# Patient Record
Sex: Female | Born: 1985 | Race: Black or African American | Hispanic: No | Marital: Single | Smoking: Former smoker
Health system: Southern US, Community
[De-identification: ages and names within clinical notes are randomized; demographics above are authoritative.]

## PROBLEM LIST (undated history)

## (undated) ENCOUNTER — Inpatient Hospital Stay (HOSPITAL_COMMUNITY): Payer: Self-pay

## (undated) DIAGNOSIS — T7840XA Allergy, unspecified, initial encounter: Secondary | ICD-10-CM

## (undated) DIAGNOSIS — D649 Anemia, unspecified: Secondary | ICD-10-CM

## (undated) DIAGNOSIS — R569 Unspecified convulsions: Secondary | ICD-10-CM

## (undated) DIAGNOSIS — J069 Acute upper respiratory infection, unspecified: Secondary | ICD-10-CM

## (undated) DIAGNOSIS — I10 Essential (primary) hypertension: Secondary | ICD-10-CM

## (undated) DIAGNOSIS — Z8709 Personal history of other diseases of the respiratory system: Secondary | ICD-10-CM

## (undated) HISTORY — DX: Acute upper respiratory infection, unspecified: J06.9

## (undated) HISTORY — DX: Anemia, unspecified: D64.9

## (undated) HISTORY — PX: HEMORRHOID BANDING: SHX5850

## (undated) HISTORY — DX: Allergy, unspecified, initial encounter: T78.40XA

## (undated) HISTORY — DX: Personal history of other diseases of the respiratory system: Z87.09

---
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
## 2005-01-19 ENCOUNTER — Emergency Department (HOSPITAL_COMMUNITY): Admission: EM | Admit: 2005-01-19 | Discharge: 2005-01-19 | Payer: Self-pay | Admitting: Family Medicine

## 2005-02-27 ENCOUNTER — Inpatient Hospital Stay (HOSPITAL_COMMUNITY): Admission: AD | Admit: 2005-02-27 | Discharge: 2005-02-27 | Payer: Self-pay | Admitting: Obstetrics

## 2005-03-01 ENCOUNTER — Emergency Department (HOSPITAL_COMMUNITY): Admission: EM | Admit: 2005-03-01 | Discharge: 2005-03-01 | Payer: Self-pay | Admitting: Emergency Medicine

## 2006-03-31 ENCOUNTER — Inpatient Hospital Stay (HOSPITAL_COMMUNITY): Admission: AD | Admit: 2006-03-31 | Discharge: 2006-03-31 | Payer: Self-pay | Admitting: Obstetrics

## 2006-04-25 ENCOUNTER — Ambulatory Visit (HOSPITAL_COMMUNITY): Admission: RE | Admit: 2006-04-25 | Discharge: 2006-04-25 | Payer: Self-pay | Admitting: Obstetrics

## 2006-08-28 ENCOUNTER — Inpatient Hospital Stay (HOSPITAL_COMMUNITY): Admission: AD | Admit: 2006-08-28 | Discharge: 2006-08-30 | Payer: Self-pay | Admitting: Obstetrics & Gynecology

## 2006-09-05 ENCOUNTER — Emergency Department (HOSPITAL_COMMUNITY): Admission: EM | Admit: 2006-09-05 | Discharge: 2006-09-05 | Payer: Self-pay | Admitting: Emergency Medicine

## 2006-10-17 ENCOUNTER — Emergency Department (HOSPITAL_COMMUNITY): Admission: EM | Admit: 2006-10-17 | Discharge: 2006-10-17 | Payer: Self-pay | Admitting: Emergency Medicine

## 2006-12-07 ENCOUNTER — Emergency Department (HOSPITAL_COMMUNITY): Admission: EM | Admit: 2006-12-07 | Discharge: 2006-12-07 | Payer: Self-pay | Admitting: Emergency Medicine

## 2006-12-12 ENCOUNTER — Inpatient Hospital Stay (HOSPITAL_COMMUNITY): Admission: AD | Admit: 2006-12-12 | Discharge: 2006-12-12 | Payer: Self-pay | Admitting: Obstetrics & Gynecology

## 2007-02-05 ENCOUNTER — Emergency Department (HOSPITAL_COMMUNITY): Admission: EM | Admit: 2007-02-05 | Discharge: 2007-02-05 | Payer: Self-pay | Admitting: Emergency Medicine

## 2007-02-11 ENCOUNTER — Emergency Department (HOSPITAL_COMMUNITY): Admission: EM | Admit: 2007-02-11 | Discharge: 2007-02-11 | Payer: Self-pay | Admitting: Emergency Medicine

## 2007-03-20 ENCOUNTER — Inpatient Hospital Stay (HOSPITAL_COMMUNITY): Admission: AD | Admit: 2007-03-20 | Discharge: 2007-03-20 | Payer: Self-pay | Admitting: Obstetrics & Gynecology

## 2007-07-28 HISTORY — PX: OTHER SURGICAL HISTORY: SHX169

## 2008-04-29 ENCOUNTER — Emergency Department (HOSPITAL_COMMUNITY): Admission: EM | Admit: 2008-04-29 | Discharge: 2008-04-29 | Payer: Self-pay | Admitting: Family Medicine

## 2009-10-03 ENCOUNTER — Emergency Department (HOSPITAL_COMMUNITY): Admission: EM | Admit: 2009-10-03 | Discharge: 2009-10-03 | Payer: Self-pay | Admitting: Family Medicine

## 2009-11-29 ENCOUNTER — Emergency Department (HOSPITAL_COMMUNITY): Admission: EM | Admit: 2009-11-29 | Discharge: 2009-11-29 | Payer: Self-pay | Admitting: Family Medicine

## 2009-12-02 ENCOUNTER — Emergency Department (HOSPITAL_COMMUNITY): Admission: EM | Admit: 2009-12-02 | Discharge: 2009-12-03 | Payer: Self-pay | Admitting: Emergency Medicine

## 2010-05-12 LAB — WET PREP, GENITAL: WBC, Wet Prep HPF POC: NONE SEEN

## 2010-05-12 LAB — POCT URINALYSIS DIPSTICK
Ketones, ur: NEGATIVE mg/dL
Specific Gravity, Urine: >=1.03 (ref 1.005–1.030)

## 2010-07-14 NOTE — Discharge Summary (Signed)
NAME:  Rhonda Kennedy, Rhonda Kennedy                        ACCOUNT NO.:  [id number]   MEDICAL RECORD NO.:  [id number]                   PATIENT TYPE:  INP   LOCATION:  9110                                 FACILITY:  [facility]   PHYSICIAN:  Charles A. Clearance Coots, M.D.             DATE OF BIRTH:  [birth year]   DATE OF ADMISSION:  05/07/2002  DATE OF DISCHARGE:  05/11/2002                                 DISCHARGE SUMMARY   ADMISSION DIAGNOSES:  1. Post dates pregnancy.  2. Two-stage induction of labor.   DISCHARGE DIAGNOSES:  1. Post dates pregnancy.  2. Two-stage induction of labor.  3. Status post normal spontaneous vaginal delivery of viable female on May 09, 2002; Apgars of 8 at one minute, 9 at five minutes; weight of 3700     grams; length of 52 cm.  Mother and infant discharged home in good     condition.   REASON FOR ADMISSION:  A 25 year old black female G1, estimated date of  confinement of May 05, 2002; admitted on May 07, 2002 for induction of  labor for post dates pregnancy.  Prenatal care at the [facility]  with onset of prenatal care at [redacted] weeks gestation.  Prenatal care was  complicated by second trimester cervicitis with bacterial vaginosis and  preterm contractions, and also labile blood pressures which resolved.  Group  B strep was positive.   PAST MEDICAL HISTORY:  1. Surgery:  None.  2. Illnesses:  None.   MEDICATIONS:  Prenatal vitamins.   ALLERGIES:  No known drug allergies.   SOCIAL HISTORY:  Single, adolescent.  Positive history of alcohol abuse.   REVIEW OF SYSTEMS:  Per history of present illness.   FAMILY HISTORY:  Noncontributory.   PHYSICAL EXAMINATION:  GENERAL:  Well-nourished, well-developed black female  in no acute distress.  HEENT:  Normal.  LUNGS:  Clear to auscultation bilaterally.  HEART:  Regular rate and rhythm.  ABDOMEN:  Soft, gravid, nontender.  PELVIC:  Cervix long, closed, posterior, vertex at a -3 station.   ADMITTING  LABORATORY VALUES:  Hemoglobin 11.2; hematocrit 33.1; white blood  cell count 9500; platelets 277,000.  Comprehensive metabolic panel was  within normal limits.  Urinalysis was within normal limits.  RPR was  nonreactive.   HOSPITAL COURSE:  The patient was admitted and initially received cervical  ripening with Cervidil.  Cervidil was unsuccessful in achieving the cervical  ripening that was desired and the patient received a second day of cervical  ripening with Cytotec per protocol.  She responded well to Cytotec cervical  ripening and on the following morning the patient was having contractions  every two minutes and progressed to active labor, and delivered a viable  female infant on May 09, 2002.  There were no intrapartum complications.  Postpartum course was uncomplicated except for some mild discomfort from  hemorrhoids and supportive measures were given for  that.  She was discharged  home on postpartum day #2 in good condition except for some anemia, for  which she was not symptomatic.   DISCHARGE LABORATORY VALUES:  Hemoglobin 8.7; hematocrit 26.4; white blood  cell count 15,200; platelets 237,000.   DISCHARGE DISPOSITION:  1. Medications:     a. Continue prenatal vitamins.     b. Hemocyte Plus one tablet daily.     c. Ibuprofen was prescribed for pain.  2. Routine written obstetrical instructions were given per booklet for     discharge.  3. The patient is to follow up at the [facility] in six weeks for     a postpartum check.                                               Charles A. Clearance Coots, M.D.    CAH/MEDQ  D:  05/11/2002  T:  05/11/2002  Job:  [id number]   cc:   Attn:  Dr. Coral Ceo [facility]

## 2010-08-23 ENCOUNTER — Ambulatory Visit (INDEPENDENT_AMBULATORY_CARE_PROVIDER_SITE_OTHER): Payer: Medicaid Other | Admitting: General Surgery

## 2010-08-23 ENCOUNTER — Encounter (INDEPENDENT_AMBULATORY_CARE_PROVIDER_SITE_OTHER): Payer: Self-pay | Admitting: General Surgery

## 2010-08-23 DIAGNOSIS — K649 Unspecified hemorrhoids: Secondary | ICD-10-CM | POA: Insufficient documentation

## 2010-08-23 DIAGNOSIS — Z803 Family history of malignant neoplasm of breast: Secondary | ICD-10-CM

## 2010-08-23 DIAGNOSIS — K648 Other hemorrhoids: Secondary | ICD-10-CM

## 2010-08-23 DIAGNOSIS — I10 Essential (primary) hypertension: Secondary | ICD-10-CM | POA: Insufficient documentation

## 2010-08-23 DIAGNOSIS — J40 Bronchitis, not specified as acute or chronic: Secondary | ICD-10-CM

## 2010-08-23 DIAGNOSIS — D649 Anemia, unspecified: Secondary | ICD-10-CM

## 2010-08-23 NOTE — Progress Notes (Signed)
Subjective:     Patient ID: Rhonda Kennedy, female   DOB: [birth year], 25 y.o.   MRN: [id number]    BP 132/82  Pulse 60  Temp(Src) 96.6 F (35.9 C) (Temporal)  Ht 5\' 4"  (1.626 m)  Wt 117 lb 12.8 oz (53.434 kg)  BMI 20.22 kg/m2    HPI The patient presents complaining of hemorrhoids. She's had for several years. We are asked to see her in consultation by her primary care provider. She has noted intermittent bleeding. There is also itching occasionally. When she has a bowel movement the hemorrhoid prolapses out. She has occasional constipation but this has not seemed to make things worse. She has no other current complaints.  Review of Systems  Constitutional: Negative.   HENT: Negative.   Eyes: Negative.   Respiratory: Negative.   Cardiovascular: Negative.   Gastrointestinal: Positive for constipation, blood in stool and anal bleeding.  Genitourinary: Negative.   Musculoskeletal: Negative.   Neurological: Negative.   Hematological: Negative.   Psychiatric/Behavioral: Negative.        Objective:   Physical Exam  Constitutional: She is oriented to person, place, and time. She appears well-developed.  HENT:  Head: Normocephalic and atraumatic.  Eyes: Pupils are equal, round, and reactive to light.  Neck: Normal range of motion. Neck supple.  Cardiovascular: Normal rate and normal heart sounds.   No murmur heard. Pulmonary/Chest: Effort normal and breath sounds normal. No respiratory distress. She has no wheezes. She has no rales.  Abdominal: Soft. Bowel sounds are normal. She exhibits no distension and no mass. There is no tenderness.  Lymphadenopathy:    She has no cervical adenopathy.  Neurological: She is alert and oriented to person, place, and time.  Skin: Skin is warm and dry.       Externally anal exam reveals several skin tags but no active external hemorrhoids. There was no perianal tenderness. Digital rectal exam revealed posterior midline internal hemorrhoids.  Anoscopy was then done demonstrating a large posterior inflamed internal hemorrhoid. This was grasped with banding forceps and was insensate. Her pain was applied and the patient tolerated this well with no significant bleeding. There were some smaller more distal internal hemorrhoids without acute inflammation. These were not amenable to banding. The patient tolerated this well. Assessment:     internal hemorrhoids    Plan:     Banding was accomplished as described above. I advised her on the usual course after banding.She will drink plenty of water to avoid constipation. I will see her back in 6 weeks.

## 2010-10-13 ENCOUNTER — Ambulatory Visit (INDEPENDENT_AMBULATORY_CARE_PROVIDER_SITE_OTHER): Payer: Medicaid Other | Admitting: General Surgery

## 2010-10-16 ENCOUNTER — Inpatient Hospital Stay (INDEPENDENT_AMBULATORY_CARE_PROVIDER_SITE_OTHER)
Admission: RE | Admit: 2010-10-16 | Discharge: 2010-10-16 | Disposition: A | Discharge status: Home / Self Care | Payer: Medicaid Other | Source: Ambulatory Visit | Attending: Emergency Medicine | Admitting: Emergency Medicine

## 2010-10-16 DIAGNOSIS — J069 Acute upper respiratory infection, unspecified: Secondary | ICD-10-CM

## 2010-10-16 LAB — POCT RAPID STREP A: Streptococcus, Group A Screen (Direct): NEGATIVE

## 2010-11-06 ENCOUNTER — Encounter (INDEPENDENT_AMBULATORY_CARE_PROVIDER_SITE_OTHER): Payer: Self-pay | Admitting: Surgery

## 2010-11-06 ENCOUNTER — Ambulatory Visit (INDEPENDENT_AMBULATORY_CARE_PROVIDER_SITE_OTHER): Payer: Medicaid Other | Admitting: Surgery

## 2010-11-06 DIAGNOSIS — K649 Unspecified hemorrhoids: Secondary | ICD-10-CM

## 2010-11-06 NOTE — Patient Instructions (Signed)

## 2010-11-06 NOTE — Progress Notes (Signed)
Subjective:     Patient ID: Rhonda Kennedy, female   DOB: January 19, 1986, 25 y.o.   MRN: [id number]  HPI  Reason for visit: Followup after hemorrhoidal banding by Dr. Janee Morn June 2012.  Patient comes today noting she still having prolapse. Having 1-2 soft/loose bowel movements a day. No severe bleeding. Some discomfort with bowel movements. She never noticed if the band passed or not.  Review of Systems  Constitutional: Negative for fever, chills, diaphoresis, appetite change and fatigue.  HENT: Negative for ear pain, sore throat, trouble swallowing, neck pain and ear discharge.   Eyes: Negative for photophobia, discharge and visual disturbance.  Respiratory: Negative for cough, choking, chest tightness and shortness of breath.   Cardiovascular: Negative for chest pain and palpitations.  Gastrointestinal: Positive for anal bleeding and rectal pain. Negative for nausea, vomiting, abdominal pain, diarrhea and constipation.  Genitourinary: Negative for dysuria, frequency and difficulty urinating.  Musculoskeletal: Negative for myalgias and gait problem.  Skin: Negative for color change, pallor and rash.  Neurological: Negative for dizziness, speech difficulty, weakness and numbness.  Hematological: Negative for adenopathy.  Psychiatric/Behavioral: Negative for confusion and agitation. The patient is not nervous/anxious.        Objective:   Physical Exam  Constitutional: She is oriented to person, place, and time. She appears well-developed and well-nourished. No distress.  HENT:  Head: Normocephalic.  Mouth/Throat: Oropharynx is clear and moist. No oropharyngeal exudate.  Eyes: Conjunctivae and EOM are normal. Pupils are equal, round, and reactive to light. No scleral icterus.  Neck: Normal range of motion. Neck supple. No tracheal deviation present.  Cardiovascular: Normal rate, regular rhythm and intact distal pulses.   Pulmonary/Chest: Effort normal and breath sounds normal. No  respiratory distress. She exhibits no tenderness.  Abdominal: Soft. She exhibits no distension and no mass. There is no tenderness. Hernia confirmed negative in the right inguinal area and confirmed negative in the left inguinal area.  Genitourinary: Vagina normal. No vaginal discharge found.       The patient's symptoms are not adequately controlled.  Therefore, I recommended banding & sclerotherapy injection to treat the hemorrhoids.  I went over the technique, risks, benefits, and alternatives.   Goals of post-operative recovery were discussed as well.  Questions were answered.  The patient expressed understanding & wished to proceed.  The patient was positioned in the lateral decubitus position.  Perianal & rectal examination was done.  Using anoscopy,  I noted R Ant>R post> L lat hemorrhoids with some spontaneous prolapse.  Ligated R ant hem, other 2 areas too sensitive to band.  Therefore injected w sclerosing agent.  She tolerated  Musculoskeletal: Normal range of motion. She exhibits no tenderness.  Lymphadenopathy:    She has no cervical adenopathy.       Right: No inguinal adenopathy present.       Left: No inguinal adenopathy present.  Neurological: She is alert and oriented to person, place, and time. No cranial nerve deficit. She exhibits normal muscle tone. Coordination normal.  Skin: Skin is warm and dry. No rash noted. She is not diaphoretic. No erythema.  Psychiatric: She has a normal mood and affect. Her behavior is normal. Judgment and thought content normal.       Assessment:     Hemorrhoids with prolapse and occasional bleeding and discomfort. Second episode of office intervention (banding and injection).     Plan:     At this point, I am hopeful that these 2 treatments will be enough  to calm things down. However, I noted that after about 2 would certainly 3 interventions in the office, I think she should more seriously consider surgery. If she still has multiple  locations, she'd be a good candidate for [facility] ligation and hemorrhoidopexy. She can consider PPH if Dr Janee Morn prefers vs standard hemorrhoidectomy.  She can followup with Dr. Janee Morn or myself as needed. The anatomy & physiology of the anorectal region was discussed.  The pathophysiology of hemorrhoids and differential diagnosis was discussed.  Natural history progression  was discussed.   I stressed the importance of a bowel regimen to have daily soft bowel movements to minimize progression of disease.     Educational handouts further explaining the pathology, treatment options, and bowel regimen were given as well.

## 2010-11-16 LAB — URINALYSIS, ROUTINE W REFLEX MICROSCOPIC
Glucose, UA: NEGATIVE
Nitrite: NEGATIVE
Protein, ur: NEGATIVE
Specific Gravity, Urine: 1.02
pH: 7.5

## 2010-11-16 LAB — POCT PREGNANCY, URINE
Operator id: 120561
Preg Test, Ur: NEGATIVE

## 2010-11-16 LAB — GC/CHLAMYDIA PROBE AMP, GENITAL
Chlamydia, DNA Probe: NEGATIVE
GC Probe Amp, Genital: NEGATIVE

## 2010-11-16 LAB — WET PREP, GENITAL: Yeast Wet Prep HPF POC: NONE SEEN

## 2010-11-16 LAB — URINE MICROSCOPIC-ADD ON

## 2010-12-06 LAB — URINALYSIS, ROUTINE W REFLEX MICROSCOPIC
Bilirubin Urine: NEGATIVE
Ketones, ur: NEGATIVE

## 2010-12-06 LAB — URINE MICROSCOPIC-ADD ON

## 2010-12-08 LAB — POCT URINALYSIS DIP (DEVICE)
Glucose, UA: NEGATIVE
Ketones, ur: NEGATIVE
Nitrite: NEGATIVE
Operator id: 247071
Specific Gravity, Urine: 1.015

## 2010-12-08 LAB — POCT PREGNANCY, URINE: Operator id: 239701

## 2010-12-08 LAB — URINE CULTURE

## 2010-12-12 LAB — CBC
HCT: 38.1
Hemoglobin: 12.9
MCHC: 33.7
MCHC: 34.9
MCV: 91
Platelets: 219
Platelets: 262
RDW: 13.4
RDW: 13.5

## 2010-12-12 LAB — RPR: RPR Ser Ql: NONREACTIVE

## 2010-12-14 ENCOUNTER — Emergency Department (HOSPITAL_COMMUNITY)
Admission: EM | Admit: 2010-12-14 | Discharge: 2010-12-14 | Disposition: A | Discharge status: Home / Self Care | Payer: Medicaid Other | Attending: Emergency Medicine | Admitting: Emergency Medicine

## 2010-12-14 ENCOUNTER — Emergency Department (HOSPITAL_COMMUNITY): Payer: Medicaid Other

## 2010-12-14 ENCOUNTER — Inpatient Hospital Stay (INDEPENDENT_AMBULATORY_CARE_PROVIDER_SITE_OTHER)
Admission: RE | Admit: 2010-12-14 | Discharge: 2010-12-14 | Disposition: A | Discharge status: Home / Self Care | Payer: Medicaid Other | Source: Ambulatory Visit | Attending: Family Medicine | Admitting: Family Medicine

## 2010-12-14 DIAGNOSIS — R569 Unspecified convulsions: Secondary | ICD-10-CM | POA: Insufficient documentation

## 2010-12-14 DIAGNOSIS — R51 Headache: Secondary | ICD-10-CM | POA: Insufficient documentation

## 2010-12-14 DIAGNOSIS — F29 Unspecified psychosis not due to a substance or known physiological condition: Secondary | ICD-10-CM | POA: Insufficient documentation

## 2010-12-14 DIAGNOSIS — X58XXXA Exposure to other specified factors, initial encounter: Secondary | ICD-10-CM | POA: Insufficient documentation

## 2010-12-14 DIAGNOSIS — S0990XA Unspecified injury of head, initial encounter: Secondary | ICD-10-CM | POA: Insufficient documentation

## 2010-12-14 DIAGNOSIS — R4182 Altered mental status, unspecified: Secondary | ICD-10-CM | POA: Insufficient documentation

## 2010-12-14 DIAGNOSIS — S01502A Unspecified open wound of oral cavity, initial encounter: Secondary | ICD-10-CM | POA: Insufficient documentation

## 2010-12-14 DIAGNOSIS — R404 Transient alteration of awareness: Secondary | ICD-10-CM | POA: Insufficient documentation

## 2010-12-14 DIAGNOSIS — I10 Essential (primary) hypertension: Secondary | ICD-10-CM | POA: Insufficient documentation

## 2010-12-14 LAB — URINALYSIS, ROUTINE W REFLEX MICROSCOPIC
Glucose, UA: >1000 mg/dL — AB
Specific Gravity, Urine: 1.024 (ref 1.005–1.030)
Urobilinogen, UA: 0.2 mg/dL (ref 0.0–1.0)

## 2010-12-14 LAB — COMPREHENSIVE METABOLIC PANEL
BUN: 8 mg/dL (ref 6–23)
CO2: 22 mEq/L (ref 19–32)
Chloride: 101 mEq/L (ref 96–112)
Creatinine, Ser: 0.72 mg/dL (ref 0.50–1.10)
GFR calc non Af Amer: >90 mL/min (ref >90)
Total Bilirubin: 0.4 mg/dL (ref 0.3–1.2)

## 2010-12-14 LAB — CBC
Hemoglobin: 14.9 g/dL (ref 12.0–15.0)
MCH: 30.2 pg (ref 26.0–34.0)
MCV: 88.2 fL (ref 78.0–100.0)
Platelets: 240 10*3/uL (ref 150–400)
RBC: 4.93 MIL/uL (ref 3.87–5.11)

## 2010-12-14 LAB — ETHANOL: Alcohol, Ethyl (B): <11 mg/dL (ref 0–11)

## 2010-12-14 LAB — RAPID URINE DRUG SCREEN, HOSP PERFORMED: Opiates: NOT DETECTED

## 2010-12-14 LAB — DIFFERENTIAL
Eosinophils Absolute: 0 10*3/uL (ref 0.0–0.7)
Eosinophils Relative: 0 % (ref 0–5)
Lymphocytes Relative: 5 % — ABNORMAL LOW (ref 12–46)
Monocytes Absolute: 1.1 10*3/uL — ABNORMAL HIGH (ref 0.1–1.0)
Monocytes Relative: 7 % (ref 3–12)

## 2010-12-14 LAB — URINE MICROSCOPIC-ADD ON

## 2010-12-28 NOTE — Consult Note (Signed)
NAMEYOLAND, SCHERR              ACCOUNT NO.:  [id number]  MEDICAL RECORD NO.:  [id number]  LOCATION:  URG                          FACILITY:  [facility]  PHYSICIAN:  Marlan Palau, M.D.  DATE OF BIRTH:  Apr 05, 1985  DATE OF CONSULTATION: DATE OF DISCHARGE:  12/14/2010                                CONSULTATION   HISTORY OF PRESENT ILLNESS:  Rhonda Kennedy is a 25 year old right- handed black female, born 17-Jun-1985 with a history of hypertension.  The patient is not on medications at this time.  The patient comes to the [facility] Room for evaluation of new onset seizures.  The patient was found by her boyfriend around 2 p.m. somewhat lethargic.  The patient had bitten her tongue.  For this reason, the patient was taken to urgent medical care.  The patient had a witnessed seizure event in the waiting room there.  The patient indicates she had a stomach ache and then this progressed onto a generalized seizure.  The patient stiffened on all portions and jerked biting her tongue.  The patient did fall at the Urgent Care and bumped her right brow.  The patient was brought to the emergency room at [facility] after receiving IV Ativan.  The patient has undergone a CT scan of the brain that reveals the scalp hematoma but otherwise the scan of the head was normal.  Cervical spine CAT scan was done that was unremarkable.  The patient was observed in the emergency room and eventually Neurology was called for an evaluation.  Urine drug screen was positive for THC but otherwise unremarkable.  Blood work showed elevation in white count of 16.  The patient denies any prior blackout events or seizure-type events.  No family history of seizures are noted.  PAST MEDICAL HISTORY:  Significant for: 1. History of new onset seizure. 2. History of hypertension. 3. History of dental extractions in the past. 4. History of asthma.  MEDICATIONS:  Albuterol inhaler 2 puffs  every 6 hours if needed.  The patient is on no other medications.  ALLERGIES:  The patient states no known allergies.  Smokes 3-4 cigarettes daily.  Drinks alcohol on occasion.  Smokes marijuana.  SOCIAL HISTORY:  The patient is single, lives with a boyfriend, has 2 children who are alive and well.  The patient works as an Environmental health practitioner.  FAMILY MEDICAL HISTORY:  Both parents are still living.  Father is in good health.  Mother has had a gunshot wound and has some dyslipidemia. The patient has 3 brothers and 3 sisters who are alive and well.  Once again, no family history of seizures is noted.  REVIEW OF SYSTEMS:  Notable for no recent fevers, chills.  The patient denies any vision changes.  Did have a headache today.  The patient usually does not get headaches.  The patient denies any neck stiffness. Denies chest pain, shortness of breath.  Pain was noted prior to the onset of the seizure.  The patient denies problems controlling the bowels or bladder.  Has not had any focal numbness or weakness on the face, arms, or legs.  Denies dizziness.  PHYSICAL EXAMINATION:  VITALS:  Blood pressure is 118/80, heart rate 78, respiratory rate 18, temperature afebrile.  GENERAL:  This patient is a fairly well-developed black female who is alert and cooperative at time of examination. HEENT:  Head is atraumatic.  Eyes:  Pupils equal, round, react to light. Disks were not visualized. NECK:  Supple.  No carotid bruits noted. RESPIRATORY:  Clear. CARDIOVASCULAR:  Reveals a regular rate and rhythm.  No obvious murmurs or rubs are noted. EXTREMITIES:  Without significant edema. NEUROLOGIC:  Cranial nerves as above.  Facial symmetry is present.  The patient has hematoma around the right eye and right eye brow.  The patient has good pinprick sensation on the face.  Speech is normal.  No aphasia or dysarthria is noted.  Significant lacerations noted at the lateral aspect of the tongue on  the right.  The patient has normal strength in all 4s.  Good symmetric motor tone is noted throughout. Sensory testing is intact to pinprick, soft touch, vibratory sensation throughout.  The patient has good finger-nose-finger and toe-to-finger bilaterally.  Gait was not tested.  Deep tendon reflexes are symmetric and normal.  No drift is seen in the upper extremities.  The patient is sleepy but is easily aroused at the time of examination.  Laboratory values are notable for white count of 16.3, hemoglobin of 14.9, hematocrit of 43.5, MCV of 88.2.  Sodium 134, potassium 3.8, chloride 101, CO2 22, glucose 112, BUN of 8, creatinine 0.72, alk phosphatase of 42, SGOT of 24, SGPT of 12, total protein 7.5 albumin 4.2, calcium 9.3.  Urinalysis reveals specific gravity of 1.024, pH of 5.5, greater than 1000 mg/dL of glucose, 0-2 red cells.  CT scan of the head is as above.  IMPRESSION: 1. New onset seizure x2 today. 2. History of hypertension.  This patient has had 2 primary generalized seizures today without aura. No family history of seizures is noted.  The patient will be placed on Keppra.  At this point, 1000 mg IV loading will go to 500 mg twice daily.  The patient will follow up with [facility] for management of the seizures and will have an outpatient MRI and EEG study.  The patient may return home at this point with observation.  The patient is not to operate a motor vehicle until further notice.     Marlan Palau, M.D.     CKW/MEDQ  D:  12/14/2010  T:  12/15/2010  Job:  [id number]  cc:   [facility]  Electronically Signed by Thana Farr M.D. on 12/28/2010 09:29:55 AM

## 2011-01-01 ENCOUNTER — Other Ambulatory Visit: Payer: Self-pay | Admitting: Neurology

## 2011-01-01 DIAGNOSIS — G40309 Generalized idiopathic epilepsy and epileptic syndromes, not intractable, without status epilepticus: Secondary | ICD-10-CM

## 2011-01-04 ENCOUNTER — Ambulatory Visit
Admission: RE | Admit: 2011-01-04 | Discharge: 2011-01-04 | Disposition: A | Discharge status: Home / Self Care | Payer: Medicaid Other | Source: Ambulatory Visit | Attending: Neurology | Admitting: Neurology

## 2011-01-04 DIAGNOSIS — G40309 Generalized idiopathic epilepsy and epileptic syndromes, not intractable, without status epilepticus: Secondary | ICD-10-CM

## 2011-01-04 MED ORDER — GADOBENATE DIMEGLUMINE 529 MG/ML IV SOLN
10.0000 mL | Freq: Once | INTRAVENOUS | Status: AC | PRN
  Administered 2011-01-04: 10 mL via INTRAVENOUS

## 2011-03-08 ENCOUNTER — Emergency Department (HOSPITAL_COMMUNITY)
Admission: EM | Admit: 2011-03-08 | Discharge: 2011-03-08 | Disposition: A | Discharge status: Home / Self Care | Payer: Medicaid Other | Source: Home / Self Care | Attending: Emergency Medicine | Admitting: Emergency Medicine

## 2011-03-08 ENCOUNTER — Encounter (HOSPITAL_COMMUNITY): Payer: Self-pay

## 2011-03-08 DIAGNOSIS — L089 Local infection of the skin and subcutaneous tissue, unspecified: Secondary | ICD-10-CM

## 2011-03-08 DIAGNOSIS — A499 Bacterial infection, unspecified: Secondary | ICD-10-CM

## 2011-03-08 DIAGNOSIS — B9689 Other specified bacterial agents as the cause of diseases classified elsewhere: Secondary | ICD-10-CM

## 2011-03-08 DIAGNOSIS — R030 Elevated blood-pressure reading, without diagnosis of hypertension: Secondary | ICD-10-CM

## 2011-03-08 HISTORY — DX: Essential (primary) hypertension: I10

## 2011-03-08 HISTORY — DX: Unspecified convulsions: R56.9

## 2011-03-08 MED ORDER — SULFAMETHOXAZOLE-TRIMETHOPRIM 800-160 MG PO TABS: 1.0000 | ORAL_TABLET | Freq: Two times a day (BID) | ORAL | Status: AC

## 2011-03-08 NOTE — ED Notes (Signed)
Pt states she noticed 2 small bumps to post. Aspect of lt upper arm on Sunday and thought something bit her.  States 2-3 days ago it began draining and is now an open area on her skin that continues to drain.

## 2011-03-08 NOTE — ED Provider Notes (Signed)
History     CSN: [id number]  Arrival date & time 03/08/11  4098   First MD Initiated Contact with Patient 03/08/11 [phone]      Chief Complaint  Patient presents with  . Insect Bite    (Consider location/radiation/quality/duration/timing/severity/associated sxs/prior treatment) HPI Comments: Slept on the floor, Saturday, woke up with two bumps on my upper L arm, they became like two little bumps and started draining pus- i use peroxide squeezed them, they drained, now have this "nasty" scar, still hurts and drains stuff", its itchy and tender  Patient is a 26 y.o. female presenting with rash. The history is provided by the patient.  Rash  This is a new problem. The current episode started more than 2 days ago. The problem has been gradually improving. There has been no fever. The rash is present on the left arm. The pain is at a severity of 5/10. The pain is mild. The pain has been constant since onset. Associated symptoms include itching and pain. Pertinent negatives include no weeping. Treatments tried: peroxide- and alcohol- The treatment provided no relief.    Past Medical History  Diagnosis Date  . Hemorrhoids   . Asthma   . History of bronchitis   . Seizures   . Hypertension     Past Surgical History  Procedure Date  . Birth control 07/2007    Mirena 100    Family History  Problem Relation Age of Onset  . Hypertension Mother   . Hyperlipidemia Mother   . Hypertension Sister   . Other Sister     anxiety    History  Substance Use Topics  . Smoking status: Current Everyday Smoker -- 0.5 packs/day    Types: Cigarettes  . Smokeless tobacco: Not on file  . Alcohol Use: 1.5 oz/week    3 drink(s) per week     per week    OB History    Grav Para Term Preterm Abortions TAB SAB Ect Mult Living                  Review of Systems  Constitutional: Negative for fever and chills.  Skin: Positive for itching and rash.    Allergies  Review of patient's allergies  indicates no known allergies.  Home Medications   Current Outpatient Rx  Name Route Sig Dispense Refill  . ALBUTEROL SULFATE (2.5 MG/3ML) 0.083% IN NEBU Nebulization Take 2.5 mg by nebulization as needed.      Marland Kitchen LEVONORGESTREL 20 MCG/24HR IU IUD Intrauterine 1 each by Intrauterine route once.      Marland Kitchen HYDROCODONE-ACETAMINOPHEN 5-500 MG PO TABS Oral Take 1 tablet by mouth as needed.     . SULFAMETHOXAZOLE-TRIMETHOPRIM 800-160 MG PO TABS Oral Take 1 tablet by mouth 2 (two) times daily. 14 tablet 0    BP 178/113  Pulse 79  Temp(Src) 99 F (37.2 C) (Oral)  Resp 16  SpO2 100%  LMP 02/28/2011  Physical Exam  Nursing note and vitals reviewed. Constitutional: She appears well-nourished. No distress.  Skin: Skin is warm. Rash noted. There is erythema.       ED Course  Procedures (including critical care time)  Labs Reviewed - No data to display No results found.   1. Bacterial skin infection   2. Elevated blood pressure       MDM  Localized resolving-infection inner aspect Left upper arm- scar tissue with minimal cellulitis        Jimmie Molly, MD 03/08/11 1035

## 2011-10-08 ENCOUNTER — Encounter (HOSPITAL_COMMUNITY): Payer: Self-pay | Admitting: *Deleted

## 2011-10-08 ENCOUNTER — Inpatient Hospital Stay (HOSPITAL_COMMUNITY)
Admission: AD | Admit: 2011-10-08 | Discharge: 2011-10-08 | Disposition: A | Discharge status: Hospice | Payer: Medicaid Other | Source: Ambulatory Visit | Attending: Obstetrics and Gynecology | Admitting: Obstetrics and Gynecology

## 2011-10-08 DIAGNOSIS — Z30432 Encounter for removal of intrauterine contraceptive device: Secondary | ICD-10-CM | POA: Insufficient documentation

## 2011-10-08 DIAGNOSIS — R109 Unspecified abdominal pain: Secondary | ICD-10-CM | POA: Insufficient documentation

## 2011-10-08 DIAGNOSIS — N949 Unspecified condition associated with female genital organs and menstrual cycle: Secondary | ICD-10-CM | POA: Insufficient documentation

## 2011-10-08 DIAGNOSIS — N938 Other specified abnormal uterine and vaginal bleeding: Secondary | ICD-10-CM | POA: Insufficient documentation

## 2011-10-08 DIAGNOSIS — N898 Other specified noninflammatory disorders of vagina: Secondary | ICD-10-CM

## 2011-10-08 DIAGNOSIS — N939 Abnormal uterine and vaginal bleeding, unspecified: Secondary | ICD-10-CM

## 2011-10-08 LAB — URINALYSIS, ROUTINE W REFLEX MICROSCOPIC
Ketones, ur: NEGATIVE mg/dL
Protein, ur: NEGATIVE mg/dL
Urobilinogen, UA: 0.2 mg/dL (ref 0.0–1.0)

## 2011-10-08 LAB — WET PREP, GENITAL: Trich, Wet Prep: NONE SEEN

## 2011-10-08 LAB — CBC
Hemoglobin: 13.4 g/dL (ref 12.0–15.0)
MCHC: 32.6 g/dL (ref 30.0–36.0)
Platelets: 235 10*3/uL (ref 150–400)
RDW: 12.6 % (ref 11.5–15.5)

## 2011-10-08 LAB — POCT PREGNANCY, URINE: Preg Test, Ur: NEGATIVE

## 2011-10-08 MED ORDER — NORGESTIMATE-ETH ESTRADIOL 0.25-35 MG-MCG PO TABS: 1.0000 | ORAL_TABLET | Freq: Every day | ORAL | Status: DC

## 2011-10-08 NOTE — MAU Note (Signed)
Pt states that she had vaginal bleeding with low abdominal cramping July 16-27 with a normal flow. She then restarted two weeks latter and heavier than usual with cramping.

## 2011-10-08 NOTE — MAU Provider Note (Signed)
History     CSN: [id number]  Arrival date and time: 10/08/11 1402   First Provider Initiated Contact with Patient 10/08/11 1627      Chief Complaint  Patient presents with  . Abdominal Pain   HPI Rhonda Kennedy is a 26 y.o. female who presents to MAU with abdominal pain. The pain started several weeks ago. LMP 09/11/11 and pain started then. Thought was just period cramps but then continued. Vaginal bleeding started again 09/29/11 and has continued. She describes the bleeding as heavier than a period. Associated symptoms include feeling tired. Last pap smear one year ago and normal. Has an IUD for birth control that has been in place over 4 years. Patient feels that the reason for the abnormal bleeding is due to the IUD and request it be removed. [place]YN [place].  Had called the GYN Clinic and told to come to MAU. Current sex partner x 4 years. Hx of GC. The history was provided by the patient.  OB History    Grav Para Term Preterm Abortions TAB SAB Ect Mult Living   5 2 2  0 3 3 0 0 0 2      Past Medical History  Diagnosis Date  . Hemorrhoids   . Asthma   . History of bronchitis   . Seizures   . Hypertension     Past Surgical History  Procedure Date  . Birth control 07/2007    Mirena 100    Family History  Problem Relation Age of Onset  . Hypertension Mother   . Hyperlipidemia Mother   . Hypertension Sister   . Other Sister     anxiety    History  Substance Use Topics  . Smoking status: Current Everyday Smoker -- 0.2 packs/day    Types: Cigarettes  . Smokeless tobacco: Not on file  . Alcohol Use: 1.5 oz/week    3 drink(s) per week     per week    Allergies: No Known Allergies  Prescriptions prior to admission  Medication Sig Dispense Refill  . albuterol (PROVENTIL HFA;VENTOLIN HFA) 108 (90 BASE) MCG/ACT inhaler Inhale 2 puffs into the lungs every 6 (six) hours as needed. wheezing      . levETIRAcetam (KEPPRA) 500 MG tablet Take 500 mg  by mouth daily.      . ranitidine (ZANTAC) 75 MG tablet Take 75 mg by mouth daily as needed. Acid reflux        Review of Systems  Constitutional: Positive for malaise/fatigue. Negative for fever, chills and weight loss.  HENT: Negative for ear pain, nosebleeds, congestion, sore throat and neck pain.   Eyes: Negative for blurred vision, double vision, photophobia and pain.  Respiratory: Negative for cough, shortness of breath and wheezing.   Cardiovascular: Negative for chest pain, palpitations and leg swelling.  Gastrointestinal: Positive for abdominal pain. Negative for heartburn, nausea, vomiting, diarrhea and constipation.  Genitourinary: Negative for dysuria, urgency and frequency.       Vaginal bleeding  Musculoskeletal: Negative for myalgias and back pain.  Skin: Negative for itching and rash.  Neurological: Negative for dizziness, sensory change, speech change, seizures, weakness and headaches.  Endo/Heme/Allergies: Does not bruise/bleed easily.  Psychiatric/Behavioral: Negative for depression. The patient is not nervous/anxious.    Physical Exam   Blood pressure 117/76, pulse 73, temperature 99.3 F (37.4 C), temperature source Oral, resp. rate 20, height 5\' 3"  (1.6 m), weight 123 lb (55.792 kg).  Physical Exam  Nursing  note and vitals reviewed. Constitutional: She is oriented to person, place, and time. She appears well-developed and well-nourished. No distress.  HENT:  Head: Normocephalic and atraumatic.  Eyes: EOM are normal.  Neck: Neck supple.  Cardiovascular: Normal rate.   Respiratory: Effort normal.  GI: Soft. There is tenderness. There is no rebound, no guarding and no CVA tenderness.       Tenderness is mild in the lower abdomen  Genitourinary: Vagina normal.       External genitalia without lesions. Moderate blood vaginal vault. Cervix with IUD string visible. No CMT, mildly tender bilateral adnexa. No mass palpated, uterus without palpable enlargement.    Musculoskeletal: Normal range of motion.  Neurological: She is alert and oriented to person, place, and time.  Skin: Skin is warm and dry.  Psychiatric: She has a normal mood and affect. Her behavior is normal. Judgment and thought content normal.   Results for orders placed during the hospital encounter of 10/08/11 (from the past 24 hour(s))  URINALYSIS, ROUTINE W REFLEX MICROSCOPIC     Status: Abnormal   Collection Time   10/08/11  2:50 PM      Component Value Range   Color, Urine YELLOW  YELLOW   APPearance CLEAR  CLEAR   Specific Gravity, Urine 1.020  1.005 - 1.030   pH 7.5  5.0 - 8.0   Glucose, UA NEGATIVE  NEGATIVE mg/dL   Hgb urine dipstick MODERATE (*) NEGATIVE   Bilirubin Urine NEGATIVE  NEGATIVE   Ketones, ur NEGATIVE  NEGATIVE mg/dL   Protein, ur NEGATIVE  NEGATIVE mg/dL   Urobilinogen, UA 0.2  0.0 - 1.0 mg/dL   Nitrite NEGATIVE  NEGATIVE   Leukocytes, UA NEGATIVE  NEGATIVE  URINE MICROSCOPIC-ADD ON     Status: Normal   Collection Time   10/08/11  2:50 PM      Component Value Range   Squamous Epithelial / LPF RARE  RARE   RBC / HPF 0-2  <3 RBC/hpf  POCT PREGNANCY, URINE     Status: Normal   Collection Time   10/08/11  3:01 PM      Component Value Range   Preg Test, Ur NEGATIVE  NEGATIVE  WET PREP, GENITAL     Status: Abnormal   Collection Time   10/08/11  4:40 PM      Component Value Range   Yeast Wet Prep HPF POC FEW (*) NONE SEEN   Trich, Wet Prep NONE SEEN  NONE SEEN   Clue Cells Wet Prep HPF POC FEW (*) NONE SEEN   WBC, Wet Prep HPF POC FEW (*) NONE SEEN  CBC     Status: Normal   Collection Time   10/08/11  4:53 PM      Component Value Range   WBC 6.6  4.0 - 10.5 K/uL   RBC 4.56  3.87 - 5.11 MIL/uL   Hemoglobin 13.4  12.0 - 15.0 g/dL   HCT 28.4  13.2 - 44.0 %   MCV 90.1  78.0 - 100.0 fL   MCH 29.4  26.0 - 34.0 pg   MCHC 32.6  30.0 - 36.0 g/dL   RDW 10.2  72.5 - 36.6 %   Platelets 235  150 - 400 K/uL   MAU Course  Procedures IUD removed without  difficulty using ring forceps  Assessment:  Abnormal vaginal bleeding  Plan:  26 y.o. female with abnormal vaginal bleeding   IUD removal   OC's    Follow up in GYN  Clinic, return here as needed.  Medication List  As of 10/08/2011  5:44 PM   START taking these medications         norgestimate-ethinyl estradiol 0.25-35 MG-MCG tablet   Commonly known as: ORTHO-CYCLEN,SPRINTEC,PREVIFEM   Take 1 tablet by mouth daily.         CONTINUE taking these medications         albuterol 108 (90 BASE) MCG/ACT inhaler   Commonly known as: PROVENTIL HFA;VENTOLIN HFA      levETIRAcetam 500 MG tablet   Commonly known as: KEPPRA      ranitidine 75 MG tablet   Commonly known as: ZANTAC          Where to get your medications    These are the prescriptions that you need to pick up.   You may get these medications from any pharmacy.         norgestimate-ethinyl estradiol 0.25-35 MG-MCG tablet           Follow-up Information    Follow up with [facility]. (someone will call you)    Contact information:   [address] [id number]        I have reviewed this patient's vital signs, nurses notes and appropriate labs . I have discussed with the patient in detail results and plan of care. Return here as needed.  Dillon Livermore, RN, FNP, [facility] 10/08/2011, 5:39 PM

## 2011-10-08 NOTE — MAU Note (Signed)
IUD x  Years, bleeding since Aug 16-27th, and then started 2 weeks later.  abd cramping, want IUD removed and wants birth control pills

## 2011-10-09 LAB — GC/CHLAMYDIA PROBE AMP, GENITAL: Chlamydia, DNA Probe: NEGATIVE

## 2011-10-10 NOTE — MAU Provider Note (Signed)
Agree with above note.  Lasha Echeverria 10/10/2011 2:53 PM

## 2011-10-11 ENCOUNTER — Telehealth: Payer: Self-pay

## 2011-10-11 NOTE — Telephone Encounter (Signed)
Called pt and notified her of + GC and treatment needed. Pt also informed that her partner will need treatment from his physician or [facility]. She should abstain from intercourse for 2 wks following her medication and also for 2 wks following her partner's treatment.  Pt voiced understanding and will come in tomorrow @ 1100 for her medication.

## 2011-10-11 NOTE — Telephone Encounter (Deleted)
Message copied by Jill Side on Thu Oct 11, 2011 12:51 PM ------      Message from: CONSTANT, PEGGY      Created: Thu Oct 11, 2011  8:17 AM      Regarding: STD       Please inform of positive chlamydia and need for treatment, if not already done            [facility]

## 2011-10-11 NOTE — Telephone Encounter (Signed)
Message copied by Faythe Casa on Thu Oct 11, 2011  8:40 AM ------      Message from: CONSTANT, PEGGY      Created: Thu Oct 11, 2011  8:17 AM      Regarding: STD       Please inform of positive chlamydia and need for treatment, if not already done            [facility]

## 2011-10-12 ENCOUNTER — Ambulatory Visit (INDEPENDENT_AMBULATORY_CARE_PROVIDER_SITE_OTHER): Payer: Medicaid Other | Admitting: Obstetrics and Gynecology

## 2011-10-12 ENCOUNTER — Encounter: Payer: Self-pay | Admitting: Obstetrics & Gynecology

## 2011-10-12 VITALS — BP 130/87 | HR 78 | Temp 98.7°F | Ht 63.0 in | Wt 126.0 lb

## 2011-10-12 DIAGNOSIS — A549 Gonococcal infection, unspecified: Secondary | ICD-10-CM

## 2011-10-12 DIAGNOSIS — A5400 Gonococcal infection of lower genitourinary tract, unspecified: Secondary | ICD-10-CM

## 2011-10-12 MED ORDER — CEFTRIAXONE SODIUM 1 G IJ SOLR
250.0000 mg | Freq: Once | INTRAMUSCULAR | Status: AC
  Administered 2011-10-12: 250 mg via INTRAMUSCULAR

## 2011-10-12 MED ORDER — AZITHROMYCIN 250 MG PO TABS: 1000.0000 mg | ORAL_TABLET | Freq: Once | ORAL | Status: DC

## 2011-11-14 ENCOUNTER — Encounter: Payer: Self-pay | Admitting: Advanced Practice Midwife

## 2011-11-14 ENCOUNTER — Other Ambulatory Visit (HOSPITAL_COMMUNITY)
Admission: RE | Admit: 2011-11-14 | Discharge: 2011-11-14 | Disposition: A | Discharge status: Home / Self Care | Payer: Medicaid Other | Source: Ambulatory Visit | Attending: Advanced Practice Midwife | Admitting: Advanced Practice Midwife

## 2011-11-14 ENCOUNTER — Ambulatory Visit (INDEPENDENT_AMBULATORY_CARE_PROVIDER_SITE_OTHER): Payer: Medicaid Other | Admitting: Advanced Practice Midwife

## 2011-11-14 VITALS — BP 140/98 | HR 71 | Temp 98.8°F | Ht 62.5 in | Wt 123.8 lb

## 2011-11-14 DIAGNOSIS — Z01419 Encounter for gynecological examination (general) (routine) without abnormal findings: Secondary | ICD-10-CM

## 2011-11-14 DIAGNOSIS — A749 Chlamydial infection, unspecified: Secondary | ICD-10-CM | POA: Insufficient documentation

## 2011-11-14 DIAGNOSIS — Z113 Encounter for screening for infections with a predominantly sexual mode of transmission: Secondary | ICD-10-CM | POA: Insufficient documentation

## 2011-11-14 DIAGNOSIS — Z87898 Personal history of other specified conditions: Secondary | ICD-10-CM

## 2011-11-14 DIAGNOSIS — Z0000 Encounter for general adult medical examination without abnormal findings: Secondary | ICD-10-CM

## 2011-11-14 DIAGNOSIS — Z8742 Personal history of other diseases of the female genital tract: Secondary | ICD-10-CM

## 2011-11-14 DIAGNOSIS — G40909 Epilepsy, unspecified, not intractable, without status epilepticus: Secondary | ICD-10-CM

## 2011-11-14 DIAGNOSIS — J45909 Unspecified asthma, uncomplicated: Secondary | ICD-10-CM

## 2011-11-14 MED ORDER — NORETHINDRONE 0.35 MG PO TABS: 1.0000 | ORAL_TABLET | Freq: Every day | ORAL | Status: DC

## 2011-11-14 NOTE — Patient Instructions (Addendum)
Health Maintenance, 18- to 26-Year-Old SCHOOL PERFORMANCE After high school completion, the young adult may be attending college, technical or vocational school, or entering the military or the work force. SOCIAL AND EMOTIONAL DEVELOPMENT The young adult establishes adult relationships and explores sexual identity. Young adults may be living at home or in a college dorm or apartment. Increasing independence is important with young adults. Throughout adolescence, teens should assume responsibility of their own health care. IMMUNIZATIONS Most young adults should be fully vaccinated. A booster dose of Tdap (tetanus, diphtheria, and pertussis, or "whooping cough"), a dose of meningococcal vaccine to protect against a certain type of bacterial meningitis, hepatitis A, human papillomarvirus (HPV), chickenpox, or measles vaccines may be indicated, if not given at an earlier age. Annual influenza or "flu" vaccination should be considered during flu season.  TESTING Annual screening for vision and hearing problems is recommended. Vision should be screened objectively at least once between 18 and 26 years of age. The young adult may be screened for anemia or tuberculosis. Young adults should have a blood test to check for high cholesterol during this time period. Young adults should be screened for use of alcohol and drugs. If the young adult is sexually active, screening for sexually transmitted infections, pregnancy, or HIV may be performed. Screening for cervical cancer should be performed within 3 years of beginning sexual activity. NUTRITION AND ORAL HEALTH  Adequate calcium intake is important. Consume 3 servings of low-fat milk and dairy products daily. For those who do not drink milk or consume dairy products, calcium enriched foods, such as juice, bread, or cereal, dark, leafy greens, or canned fish are alternate sources of calcium.   Drink plenty of water. Limit fruit juice to 8 to 12 ounces per day.  Avoid sugary beverages or sodas.   Discourage skipping meals, especially breakfast. Teens should eat a good variety of vegetables and fruits, as well as lean meats.   Avoid high fat, high salt, and high sugar foods, such as candy, chips, and cookies.   Encourage young adults to participate in meal planning and preparation.   Eat meals together as a family whenever possible. Encourage conversation at mealtime.   Limit fast food choices and eating out at restaurants.   Brush teeth twice a day and floss.   Schedule dental exams twice a year.  SLEEP Regular sleep habits are important. PHYSICAL, SOCIAL, AND EMOTIONAL DEVELOPMENT  One hour of regular physical activity daily is recommended. Continue to participate in sports.   Encourage young adults to develop their own interests and consider community service or volunteerism.   Provide guidance to the young adult in making decisions about college and work plans.   Make sure that young adults know that they should never be in a situation that makes them uncomfortable, and they should tell partners if they do not want to engage in sexual activity.   Talk to the young adult about body image. Eating disorders may be noted at this time. Young adults may also be concerned about being overweight. Monitor the young adult for weight gain or loss.   Mood disturbances, depression, anxiety, alcoholism, or attention problems may be noted in young adults. Talk to the caregiver if there are concerns about mental illness.   Negotiate limit setting and independent decision making.   Encourage the young adult to handle conflict without physical violence.   Avoid loud noises which may impair hearing.   Limit television and computer time to 2 hours per day.   Individuals who engage in excessive sedentary activity are more likely to become overweight.  RISK BEHAVIORS  Sexually active young adults need to take precautions against pregnancy and sexually  transmitted infections. Talk to young adults about contraception.   Provide a tobacco-free and drug-free environment for the young adult. Talk to the young adult about drug, tobacco, and alcohol use among friends or at friends' homes. Make sure the young adult knows that smoking tobacco or marijuana and taking drugs have health consequences and may impact brain development.   Teach the young adult about appropriate use of over-the-counter or prescription medicines.   Establish guidelines for driving and for riding with friends.   Talk to young adults about the risks of drinking and driving or boating. Encourage the young adult to call you if he or she or friends have been drinking or using drugs.   Remind young adults to wear seat belts at all times in cars and life vests in boats.   Young adults should always wear a properly fitted helmet when they are riding a bicycle.   Use caution with all-terrain vehicles (ATVs) or other motorized vehicles.   Do not keep handguns in the home. (If you do, the gun and ammunition should be locked separately and out of the young adult's access.)   Equip your home with smoke detectors and change the batteries regularly. Make sure all family members know the fire escape plans for your home.   Teach young adults not to swim alone and not to dive in shallow water.   All individuals should wear sunscreen that protects against UVA and UVB light with at least a sun protection factor (SPF) of 30 when out in the sun. This minimizes sun burning.  WHAT'S NEXT? Young adults should visit their pediatrician or family physician yearly. By young adulthood, health care should be transitioned to a family physician or internal medicine specialist. Sexually active females may want to begin annual physical exams with a gynecologist. Document Released: 05/10/2006 Document Revised: 02/01/2011 Document Reviewed: 05/30/2006 Exit[employer] Patient Information 2012 [employer]. 

## 2011-11-14 NOTE — Progress Notes (Signed)
  Subjective:     Rhonda Kennedy is a 26 y.o. female here for a routine exam.  Current complaints: none.  Personal health questionnaire reviewed: not asked.  She has a history of smoking less than half pack per day and hypertension. She has a medical doctor but does not like him or understand his accent. Was given antihypertensive med, but does not take. Is not ready to stop smoking.    Gynecologic History Patient's last menstrual period was 11/07/2011. Contraception: OCP (estrogen/progesterone)  Mirena IUD removed, was hurting her / ?out of place,  may want another. Last Pap: not sure. Results were: abnormal Last mammogram: none.  Obstetric History OB History    Grav Para Term Preterm Abortions TAB SAB Ect Mult Living   5 2 2  0 3 3 0 0 0 2     # Outc Date GA Lbr Len/2nd Wgt Sex Del Anes PTL Lv   1 TAB            2 TAB            3 TAB            4 TRM            5 TRM                The following portions of the patient's history were reviewed and updated as appropriate: allergies, current medications, past family history, past medical history, past social history, past surgical history and problem list.  Review of Systems Pertinent items are noted in HPI.    Objective:    BP 140/98  Pulse 71  Temp 98.8 F (37.1 C) (Oral)  Ht 5' 2.5" (1.588 m)  Wt 123 lb 12.8 oz (56.155 kg)  BMI 22.28 kg/m2  LMP 11/07/2011 General appearance: alert, cooperative, appears stated age and no distress Breasts: normal appearance, no masses or tenderness, Inspection negative, No nipple retraction or dimpling, Normal to palpation without dominant masses, Taught monthly breast self examination Abdomen: soft, non-tender; bowel sounds normal; no masses,  no organomegaly Pelvic: cervix normal in appearance, external genitalia normal, no adnexal masses or tenderness, no cervical motion tenderness, rectovaginal septum normal, uterus normal size, shape, and consistency and vagina normal without  discharge Skin: Skin color, texture, turgor normal. No rashes or lesions    Assessment:    Healthy female exam.    Plan:   Pap done with cultures  Education reviewed: safe sex/STD prevention, self breast exams and smoking cessation. Contraception: MiniPill but may want Mirena again. Encouraged to establish care with primary doctor. Discussed Hypertension and risks of nontreatment. Discussed risks of estrogen with smoking and HTN Return one year

## 2011-11-22 ENCOUNTER — Encounter: Payer: Self-pay | Admitting: Advanced Practice Midwife

## 2012-07-27 ENCOUNTER — Emergency Department (HOSPITAL_COMMUNITY)
Admission: EM | Admit: 2012-07-27 | Discharge: 2012-07-27 | Disposition: A | Discharge status: Home / Self Care | Payer: Medicaid Other | Source: Home / Self Care | Attending: Family Medicine | Admitting: Family Medicine

## 2012-07-27 ENCOUNTER — Encounter (HOSPITAL_COMMUNITY): Payer: Self-pay | Admitting: *Deleted

## 2012-07-27 ENCOUNTER — Emergency Department (INDEPENDENT_AMBULATORY_CARE_PROVIDER_SITE_OTHER): Payer: Medicaid Other

## 2012-07-27 DIAGNOSIS — J069 Acute upper respiratory infection, unspecified: Secondary | ICD-10-CM

## 2012-07-27 MED ORDER — PSEUDOEPH-BROMPHEN-DM 30-2-10 MG/5ML PO SYRP: ORAL_SOLUTION | ORAL | Status: DC

## 2012-07-27 MED ORDER — METHYLPREDNISOLONE 4 MG PO KIT: PACK | ORAL | Status: DC

## 2012-07-27 MED ORDER — ALBUTEROL SULFATE HFA 108 (90 BASE) MCG/ACT IN AERS: 2.0000 | INHALATION_SPRAY | RESPIRATORY_TRACT | Status: DC | PRN

## 2012-07-27 NOTE — ED Notes (Addendum)
Patient complains of chest congestion with cough and sore throat x 4 days with headache. States some fever/chills at home. Denies nausea and diarrhea. States thick sputum from lungs that is hard to get up and has red and yellow color.

## 2012-07-27 NOTE — ED Provider Notes (Signed)
History     CSN: [id number]  Arrival date & time 07/27/12  1208   First MD Initiated Contact with Patient 07/27/12 1357      Chief Complaint  Patient presents with  . Sore Throat    (Consider location/radiation/quality/duration/timing/severity/associated sxs/prior treatment) HPI Comments: Pt presents c/o productive cough, sore throat, sinus drainage for 3 days.  The throat was painful yesterday but seems to be getting better today.  Denies fever, chills, pleuritic pain, SOB.  She has not tried any OTCs.    Patient is a 27 y.o. female presenting with pharyngitis.  Sore Throat Pertinent negatives include no chest pain, no abdominal pain and no shortness of breath.    Past Medical History  Diagnosis Date  . Hemorrhoids   . Asthma   . History of bronchitis   . Seizures   . Hypertension     Past Surgical History  Procedure Laterality Date  . Birth control  07/2007    Mirena 100    Family History  Problem Relation Age of Onset  . Hypertension Mother   . Hyperlipidemia Mother   . Hypertension Sister   . Other Sister     anxiety    History  Substance Use Topics  . Smoking status: Current Every Day Smoker -- 0.25 packs/day    Types: Cigarettes  . Smokeless tobacco: Not on file  . Alcohol Use: 1.5 oz/week    3 drink(s) per week     Comment: per week    OB History   Grav Para Term Preterm Abortions TAB SAB Ect Mult Living   5 2 2  0 3 3 0 0 0 2      Review of Systems  Constitutional: Negative for fever and chills.  HENT: Positive for sore throat and postnasal drip.   Eyes: Negative for visual disturbance.  Respiratory: Positive for cough. Negative for shortness of breath.   Cardiovascular: Negative for chest pain, palpitations and leg swelling.  Gastrointestinal: Negative for nausea, vomiting and abdominal pain.  Endocrine: Negative for polydipsia and polyuria.  Genitourinary: Negative for dysuria, urgency and frequency.  Musculoskeletal: Negative for  myalgias and arthralgias.  Skin: Negative for rash.  Neurological: Negative for dizziness, weakness and light-headedness.    Allergies  Review of patient's allergies indicates no known allergies.  Home Medications   Current Outpatient Rx  Name  Route  Sig  Dispense  Refill  . levETIRAcetam (KEPPRA) 500 MG tablet   Oral   Take 500 mg by mouth daily.         Marland Kitchen albuterol (PROVENTIL HFA;VENTOLIN HFA) 108 (90 BASE) MCG/ACT inhaler   Inhalation   Inhale 2 puffs into the lungs every 6 (six) hours as needed. wheezing         . albuterol (PROVENTIL HFA;VENTOLIN HFA) 108 (90 BASE) MCG/ACT inhaler   Inhalation   Inhale 2 puffs into the lungs every 4 (four) hours as needed for wheezing.   1 Inhaler   0   . brompheniramine-pseudoephedrine-DM 30-2-10 MG/5ML syrup      2 tsp PO QID PRN   120 mL   1   . methylPREDNISolone (MEDROL DOSEPAK) 4 MG tablet      Use as directed   21 tablet   0     Dispense as written.   . norethindrone (MICRONOR,CAMILA,ERRIN) 0.35 MG tablet   Oral   Take 1 tablet (0.35 mg total) by mouth daily.   1 Package   11   . norgestimate-ethinyl estradiol (ORTHO-CYCLEN,SPRINTEC,PREVIFEM)  0.25-35 MG-MCG tablet   Oral   Take 1 tablet by mouth daily.   1 Package   1   . ranitidine (ZANTAC) 75 MG tablet   Oral   Take 75 mg by mouth daily as needed. Acid reflux           BP 141/96  Pulse 81  Temp(Src) 98.9 F (37.2 C) (Oral)  Resp 19  SpO2 100%  LMP 07/11/2012  Physical Exam  Nursing note and vitals reviewed. Constitutional: She is oriented to person, place, and time. Vital signs are normal. She appears well-developed and well-nourished. No distress.  HENT:  Head: Atraumatic.  Eyes: EOM are normal. Pupils are equal, round, and reactive to light.  Cardiovascular: Normal rate, regular rhythm and normal heart sounds.  Exam reveals no gallop and no friction rub.   No murmur heard. Pulmonary/Chest: Effort normal and breath sounds normal. No  respiratory distress. She has no wheezes. She has no rales.  Abdominal: Soft. There is no tenderness.  Neurological: She is alert and oriented to person, place, and time. She has normal strength.  Skin: Skin is warm and dry. She is not diaphoretic.  Psychiatric: She has a normal mood and affect. Her behavior is normal. Judgment normal.    ED Course  Procedures (including critical care time)  Labs Reviewed - No data to display Dg Chest 2 View  07/27/2012   *RADIOLOGY REPORT*  Clinical Data: Sore throat, cough, chest pain  CHEST - 2 VIEW  Comparison: 08/28/2003  Findings: Lungs are clear. No pleural effusion or pneumothorax.  Cardiomediastinal silhouette is within normal limits.  Visualized osseous structures are within normal limits.  IMPRESSION: Normal chest radiographs.   Original Report Authenticated By: Charline Bills, M.D.     1. URI (upper respiratory infection)       MDM  With normal exam and normal XR, this is viral URI.  Will treat symptomatically and have her f/u if she gets any worse or does not improve within about a week.     Meds ordered this encounter  Medications  . brompheniramine-pseudoephedrine-DM 30-2-10 MG/5ML syrup    Sig: 2 tsp PO QID PRN    Dispense:  120 mL    Refill:  1  . methylPREDNISolone (MEDROL DOSEPAK) 4 MG tablet    Sig: Use as directed    Dispense:  21 tablet    Refill:  0  . albuterol (PROVENTIL HFA;VENTOLIN HFA) 108 (90 BASE) MCG/ACT inhaler    Sig: Inhale 2 puffs into the lungs every 4 (four) hours as needed for wheezing.    Dispense:  1 Inhaler    Refill:  0           Graylon Good, PA-C 07/27/12 [phone]

## 2012-07-29 NOTE — ED Provider Notes (Signed)
Medical screening examination/treatment/procedure(s) were performed by resident physician or non-physician practitioner and as supervising physician I was immediately available for consultation/collaboration.   Lillis Nuttle DOUGLAS MD.   Shaneka Efaw D Tre Sanker, MD 07/29/12 1349 

## 2012-08-21 ENCOUNTER — Ambulatory Visit: Payer: Self-pay | Admitting: Obstetrics and Gynecology

## 2012-11-21 ENCOUNTER — Encounter: Payer: Self-pay | Admitting: Family

## 2012-11-21 ENCOUNTER — Other Ambulatory Visit (HOSPITAL_COMMUNITY)
Admission: RE | Admit: 2012-11-21 | Discharge: 2012-11-21 | Disposition: A | Discharge status: Home / Self Care | Payer: Medicaid Other | Source: Ambulatory Visit | Attending: Family | Admitting: Family

## 2012-11-21 ENCOUNTER — Ambulatory Visit (INDEPENDENT_AMBULATORY_CARE_PROVIDER_SITE_OTHER): Payer: Medicaid Other | Admitting: Family

## 2012-11-21 VITALS — BP 147/90 | HR 75 | Temp 98.5°F | Ht 62.5 in | Wt 119.6 lb

## 2012-11-21 DIAGNOSIS — Z01419 Encounter for gynecological examination (general) (routine) without abnormal findings: Secondary | ICD-10-CM | POA: Insufficient documentation

## 2012-11-21 DIAGNOSIS — Z113 Encounter for screening for infections with a predominantly sexual mode of transmission: Secondary | ICD-10-CM | POA: Insufficient documentation

## 2012-11-21 NOTE — Progress Notes (Signed)
  Subjective:     Rhonda Kennedy is a 27 y.o. female here for a routine exam.  Current complaints: reports moodiness, bloating, and night sweats during cycle.  Desires STD screen.  With current partner x 5 years, symptoms of infection at this time.    Gynecologic History Patient's last menstrual period was 11/06/2012. Contraception: oral progesterone-only contraceptive Last Pap: 2013. Results were: normal Last mammogram: n/a.   Obstetric History OB History  Gravida Para Term Preterm AB SAB TAB Ectopic Multiple Living  5 2 2  0 3 0 3 0 0 2    # Outcome Date GA Lbr Len/2nd Weight Sex Delivery Anes PTL Lv  5 TRM      SVD     4 TRM      SVD     3 TAB           2 TAB           1 TAB                The following portions of the patient's history were reviewed and updated as appropriate: allergies, current medications, past family history, past medical history, past social history, past surgical history and problem list.  Review of Systems Pertinent items are noted in HPI.    Objective:  BP 141/93  Pulse 75  Temp(Src) 98.5 F (36.9 C)  Ht 5' 2.5" (1.588 m)  Wt 119 lb 9.6 oz (54.25 kg)  BMI 21.51 kg/m2  LMP 11/06/2012   General appearance: alert, cooperative and no distress Neck: no adenopathy, no carotid bruit, no JVD, supple, symmetrical, trachea midline and thyroid not enlarged, symmetric, no tenderness/mass/nodules Lungs: clear to auscultation bilaterally Breasts: normal appearance, no masses or tenderness, No nipple retraction or dimpling, No nipple discharge or bleeding, Normal to palpation without dominant masses Heart: regular rate and rhythm, S1, S2 normal, no murmur, click, rub or gallop Abdomen: soft, non-tender; bowel sounds normal; no masses,  no organomegaly Pelvic: cervix normal in appearance, external genitalia normal, no adnexal masses or tenderness, no cervical motion tenderness, rectovaginal septum normal, uterus normal size, shape, and consistency and thin  malodorous white discharge Skin: Skin color, texture, turgor normal. No rashes or lesions Lymph nodes: Cervical, supraclavicular, and axillary nodes normal.    Assessment:    Healthy female exam.   Vaginal Discharge  Plan:   Labs:  Pap smear/GC/CT, HIV, wet prep collected  Education reviewed: self breast exams. Contraception: oral progesterone-only contraceptive. Follow up in: 1 year.    I examined pt and agree with documentation above and nurse midwife student plan of care. [facility]

## 2012-11-21 NOTE — Progress Notes (Signed)
Here for annual exam and to get checked out since mirena removed , and std testing.  Discussed with patient she needs to be seen by her primary care provider for hypertension since her bp is elevated. She states she had medicaid [place] access and her provider is [facility] which she says she has been to , but wants to change it. Instructed her to  Call her Careers information officer and work with them to get it changed- she states she has already been trying to do that but has been busy

## 2012-11-22 ENCOUNTER — Other Ambulatory Visit: Payer: Self-pay | Admitting: Family

## 2012-11-22 LAB — WET PREP, GENITAL
Trich, Wet Prep: NONE SEEN
Yeast Wet Prep HPF POC: NONE SEEN

## 2012-11-22 MED ORDER — METRONIDAZOLE 500 MG PO TABS: 500.0000 mg | ORAL_TABLET | Freq: Two times a day (BID) | ORAL | Status: DC

## 2012-11-22 NOTE — Progress Notes (Signed)
RX for Flagyl called to pharmacy.  Will notify pt via phone.

## 2012-11-24 ENCOUNTER — Other Ambulatory Visit: Payer: Self-pay | Admitting: Family

## 2012-11-24 MED ORDER — METRONIDAZOLE 500 MG PO TABS: 500.0000 mg | ORAL_TABLET | Freq: Two times a day (BID) | ORAL | Status: DC

## 2012-11-24 NOTE — Progress Notes (Signed)
Called and informed patient regarding clue cells on wet prep and that RX for flagyl was sent to pharmacy.

## 2012-12-09 ENCOUNTER — Ambulatory Visit (INDEPENDENT_AMBULATORY_CARE_PROVIDER_SITE_OTHER): Payer: Medicaid Other | Admitting: Nurse Practitioner

## 2012-12-09 ENCOUNTER — Encounter: Payer: Self-pay | Admitting: Nurse Practitioner

## 2012-12-09 VITALS — BP 145/99 | HR 65 | Ht 64.0 in | Wt 119.0 lb

## 2012-12-09 DIAGNOSIS — G40309 Generalized idiopathic epilepsy and epileptic syndromes, not intractable, without status epilepticus: Secondary | ICD-10-CM

## 2012-12-09 MED ORDER — LEVETIRACETAM 500 MG PO TABS: 500.0000 mg | ORAL_TABLET | Freq: Every day | ORAL | Status: DC

## 2012-12-09 NOTE — Progress Notes (Signed)
[facility]  PATIENT: Rhonda Kennedy DOB: 06/15/1985   REASON FOR VISIT: Followup for seizure   HISTORY OF PRESENT ILLNESS: Rhonda Kennedy is a 27 year old right-handed black female with a history of seizures. She was last seen 12/12/11. Last seizure activity occurred in January 2013 after missing doses of Keppra. This occurred in her sleep. The patient had  a MRI of the brain that was unremarkable, and has had an EEG study that was also normal. The patient does report some drowsiness, and she admits to not getting enough sleep The patient has not had any further seizures. He is currently not working but is in school for a business degree. No new neurologic complaints   UPDATE: 12/12/11: Returns for followup. No further seizure activity, no missed doses of Keppra. No new  HISTORY: The patient was seen through the emergency room on 14 December 2010. The patient had 2 generalized seizure events occurring out of sleep, associated with tongue biting. A CT scan of the brain at that time was unremarkable, and the patient was placed on Keppra. The patient underwent a MRI of the brain that was unremarkable, and has had an EEG study that was also normal. The patient does report some drowsiness, but it is not clear that she is getting adequate sleep. The patient has not had any further seizures.    REVIEW OF SYSTEMS: Full 14 system review of systems performed and notable only for:  Constitutional: Fatigue Cardiovascular: N/A  Ear/Nose/Throat: N/A  Skin: N/A  Eyes: N/A  Respiratory: N/A  Gastroitestinal: N/A  Hematology/Lymphatic: N/A  Endocrine: N/A Musculoskeletal:N/A  Allergy/Immunology: N/A  Neurological: Headache  Psychiatric: Decreased energy, disinterest in activities  ALLERGIES: No Known Allergies  HOME MEDICATIONS: Outpatient Prescriptions Prior to Visit  Medication Sig Dispense Refill  . albuterol (PROVENTIL HFA;VENTOLIN HFA) 108 (90 BASE) MCG/ACT inhaler Inhale  2 puffs into the lungs every 4 (four) hours as needed for wheezing.  1 Inhaler  0  . levETIRAcetam (KEPPRA) 500 MG tablet Take 500 mg by mouth daily.      . ranitidine (ZANTAC) 75 MG tablet Take 75 mg by mouth daily as needed. Acid reflux      . norethindrone (MICRONOR,CAMILA,ERRIN) 0.35 MG tablet Take 1 tablet (0.35 mg total) by mouth daily.  1 Package  11  . brompheniramine-pseudoephedrine-DM 30-2-10 MG/5ML syrup 2 tsp PO QID PRN  120 mL  1  . metroNIDAZOLE (FLAGYL) 500 MG tablet Take 1 tablet (500 mg total) by mouth 2 (two) times daily.  28 tablet  0   Facility-Administered Medications Prior to Visit  Medication Dose Route Frequency Provider Last Rate Last Dose  . azithromycin (ZITHROMAX) tablet 1,000 mg  1,000 mg Oral Once Aviva Signs, CNM        PAST MEDICAL HISTORY: Past Medical History  Diagnosis Date  . Hemorrhoids   . History of bronchitis   . Seizures     Onset 27 yo  . Asthma     Diagnosed as child  . Hypertension     2012    PAST SURGICAL HISTORY: Past Surgical History  Procedure Laterality Date  . Birth control  07/2007    Mirena 100    FAMILY HISTORY: Family History  Problem Relation Age of Onset  . Hypertension Mother   . Hyperlipidemia Mother   . Hypertension Sister   . Other Sister     anxiety    SOCIAL HISTORY: History   Social History  . Marital Status: Single  Spouse Name: N/A    Number of Children: 2  . Years of Education: N/A   Occupational History  . Not working   Social History Main Topics  . Smoking status: Current Every Day Smoker -- 0.25 packs/day    Types: Cigarettes  . Smokeless tobacco: Never Used  . Alcohol Use: 1.5 oz/week    3 drink(s) per week     Comment: per week  . Drug Use: Yes    Special: Marijuana     Comment: marijuana  . Sexual Activity: Yes    Birth Control/ Protection: Pill   Other Topics Concern  . Not on file   Social History Narrative  . No narrative on file     PHYSICAL EXAM  Filed  Vitals:   12/09/12 1104  BP: 145/99  Pulse: 65  Height: 5\' 4"  (1.626 m)  Weight: 119 lb (53.978 kg)   Body mass index is 20.42 kg/(m^2).  Generalized: Well developed, in no acute distress  Neurological examination   Mentation: Alert oriented to time, place, history taking. Follows all commands speech and language fluent  Cranial nerve II-XII: Pupils were equal round reactive to light extraocular movements were full, visual field were full on confrontational test. Facial sensation and strength were normal. hearing was intact to finger rubbing bilaterally. Uvula tongue midline. head turning and shoulder shrug and were normal and symmetric.Tongue protrusion into cheek strength was normal. Motor: normal bulk and tone, full strength in the BUE, BLE, fine finger movements normal, no pronator drift. No focal weakness Coordination: finger-nose-finger, heel-to-shin bilaterally, no dysmetria Reflexes: Brachioradialis 2/2, biceps 2/2, triceps 2/2, patellar 2/2, Achilles 2/2, plantar responses were flexor bilaterally. Gait and Station: Rising up from seated position without assistance, normal stance,  moderate stride, good arm swing, smooth turning, able to perform tiptoe, and heel walking without difficulty. Tandem steady  DIAGNOSTIC DATA (LABS, IMAGING, TESTING) - I reviewed patient records, labs, notes, testing and imaging myself where available.  Lab Results  Component Value Date   WBC 6.6 10/08/2011   HGB 13.4 10/08/2011   HCT 41.1 10/08/2011   MCV 90.1 10/08/2011   PLT 235 10/08/2011     ASSESSMENT AND PLAN  27 y.o. year old female  has a past medical history of  Seizures; here for followup. Last seizure January 2013. Currently on Keppra without side effects  Continue Keppra at current dose, will refill Call for any seizure activity F/U yearly Nilda Riggs, [facility], [facility], APRN  [facility] [address] [place], [place] [address] [phone]

## 2012-12-09 NOTE — Progress Notes (Signed)
I have read the note, and I agree with the clinical assessment and plan.  Rhonda Kennedy,Rhonda Kennedy   

## 2012-12-09 NOTE — Patient Instructions (Signed)
Continue Keppra at current dose, will refill Call for any seizure activity F/U yearly  

## 2013-01-01 ENCOUNTER — Other Ambulatory Visit: Payer: Self-pay

## 2013-06-26 ENCOUNTER — Emergency Department (HOSPITAL_COMMUNITY)
Admission: EM | Admit: 2013-06-26 | Discharge: 2013-06-26 | Disposition: A | Discharge status: Home / Self Care | Payer: Medicaid Other | Source: Home / Self Care

## 2013-06-26 ENCOUNTER — Encounter (HOSPITAL_COMMUNITY): Payer: Self-pay | Admitting: Emergency Medicine

## 2013-06-26 DIAGNOSIS — J029 Acute pharyngitis, unspecified: Secondary | ICD-10-CM

## 2013-06-26 DIAGNOSIS — H1013 Acute atopic conjunctivitis, bilateral: Secondary | ICD-10-CM

## 2013-06-26 DIAGNOSIS — H1010 Acute atopic conjunctivitis, unspecified eye: Secondary | ICD-10-CM

## 2013-06-26 DIAGNOSIS — J309 Allergic rhinitis, unspecified: Secondary | ICD-10-CM

## 2013-06-26 MED ORDER — FEXOFENADINE HCL 60 MG PO TABS: 60.0000 mg | ORAL_TABLET | Freq: Two times a day (BID) | ORAL | Status: DC

## 2013-06-26 MED ORDER — FLUTICASONE PROPIONATE 50 MCG/ACT NA SUSP: 2.0000 | Freq: Every day | NASAL | Status: DC

## 2013-06-26 MED ORDER — TETRACAINE HCL 0.5 % OP SOLN
OPHTHALMIC | Status: AC
  Filled 2013-06-26: qty 2

## 2013-06-26 MED ORDER — KETOTIFEN FUMARATE 0.025 % OP SOLN: 1.0000 [drp] | Freq: Two times a day (BID) | OPHTHALMIC | Status: DC

## 2013-06-26 MED ORDER — ERYTHROMYCIN 5 MG/GM OP OINT: TOPICAL_OINTMENT | OPHTHALMIC | Status: DC

## 2013-06-26 NOTE — ED Notes (Signed)
C/o  Sore throat x 4 days.   Bilateral eye irritation, pain, and drainage for a couple of weeks.  Denies fever, n/v/d.   No relief with eye drops or warm compresses.

## 2013-06-26 NOTE — ED Provider Notes (Signed)
CSN: [id number]     Arrival date & time 06/26/13  [id number] History   First MD Initiated Contact with Patient 06/26/13 [phone]     Chief Complaint  Patient presents with  . Eye Drainage  . Sore Throat   (Consider location/radiation/quality/duration/timing/severity/associated sxs/prior Treatment) HPI Comments: C/o ST for 3 d and puffy, itchy L eye for a few weeks. More recently FB sensation OS with mucopurulent drainage. Sorethroat for 3 days. PND.   Past Medical History  Diagnosis Date  . Hemorrhoids   . History of bronchitis   . Seizures     Onset 28 yo  . Asthma     Diagnosed as child  . Hypertension     2012   Past Surgical History  Procedure Laterality Date  . Birth control  07/2007    Mirena 100   Family History  Problem Relation Age of Onset  . Hypertension Mother   . Hyperlipidemia Mother   . Hypertension Sister   . Other Sister     anxiety   History  Substance Use Topics  . Smoking status: Current Every Day Smoker -- 0.25 packs/day    Types: Cigarettes  . Smokeless tobacco: Never Used  . Alcohol Use: 1.5 oz/week    3 drink(s) per week     Comment: per week   OB History   Grav Para Term Preterm Abortions TAB SAB Ect Mult Living   5 2 2  0 3 3 0 0 0 2     Review of Systems  Constitutional: Negative.   HENT: Negative.   Eyes: Positive for pain, discharge, redness and itching. Negative for photophobia and visual disturbance.  Respiratory: Negative for cough, choking, shortness of breath and wheezing.   Cardiovascular: Negative.   Gastrointestinal: Negative.   Allergic/Immunologic: Positive for environmental allergies.  Neurological: Negative.     Allergies  Review of patient's allergies indicates no known allergies.  Home Medications   Prior to Admission medications   Medication Sig Start Date End Date Taking? Authorizing Provider  levETIRAcetam (KEPPRA) 500 MG tablet Take 1 tablet (500 mg total) by mouth daily. 12/09/12  Yes Nilda RiggsNancy Carolyn Martin, NP   albuterol (PROVENTIL HFA;VENTOLIN HFA) 108 (90 BASE) MCG/ACT inhaler Inhale 2 puffs into the lungs every 4 (four) hours as needed for wheezing. 07/27/12   Adrian BlackwaterZachary H Baker, PA-C  ranitidine (ZANTAC) 75 MG tablet Take 75 mg by mouth daily as needed. Acid reflux    Historical Provider, MD   BP 163/113  Pulse 85  Temp(Src) 98.8 F (37.1 C) (Oral)  Resp 18  SpO2 100%  LMP 05/27/2013 Physical Exam  Nursing note and vitals reviewed. Constitutional: She is oriented to person, place, and time. She appears well-developed and well-nourished. No distress.  HENT:  Right Ear: External ear normal.  Left Ear: External ear normal.  Mouth/Throat: No oropharyngeal exudate.  OP with cobblestoning, clear PND and mild redness.   Eyes: Conjunctivae are normal.  Minor erythema OS conjunctiva Moderate OD conjunctival swelling and erythema, light scleral injection. Watery drainage now.  After tetracaine, fluro stain no uptake or FB seen. Irrigated with 100 cc of eye wash.   Neck: Normal range of motion. Neck supple.  Cardiovascular: Normal rate.   Pulmonary/Chest: Effort normal and breath sounds normal.  Neurological: She is alert and oriented to person, place, and time. She exhibits normal muscle tone.  Skin: Skin is warm and dry.  Psychiatric: She has a normal mood and affect.    ED Course  Procedures (  including critical care time) Labs Review Labs Reviewed  CULTURE, GROUP A STREP    Imaging Review No results found.   MDM   1. Allergic rhinitis   2. Allergic pharyngitis   3. Acute allergic conjunctivitis of both eyes     Suspect both eyes wit allergic sx's. Left with thicker drainage. Tx with erythromycin. Both eyes zaditor gtts Allegra flonase Fluids.    Hayden Rasmussenavid Jasey Cortez, NP 06/26/13 1007

## 2013-06-26 NOTE — Discharge Instructions (Signed)
Allergic Conjunctivitis A thin membrane (conjunctiva) covers the eyeball and underside of the eyelids. Allergic conjunctivitis happens when the thin membrane gets irritated from things like animal dander, pollen, perfumes, or smoke (allergens). The membrane may become puffy (swollen) and red. Small bumps may form on the inside of the eyelids. Your eyes may get teary, itchy, or burn. It cannot be passed to another person (contagious).  HOME CARE  Wash your hands before and after applying medicated drops or creams.  Do not touch the drop or cream tube to your eye or eyelids.  Do not use your soft contacts. Throw them away. Use a new pair once recovery is complete.  Do not use your hard contacts. They need to be washed (sterilized) thoroughly after recovery is complete.  Put a cold cloth to your eye(s) if you have itching and burning. GET HELP RIGHT AWAY IF:   You are not feeling better in 2 to 3 days after treatment.  Your lids are sticky or stick together.  Fluid comes from the eye(s).  You become sensitive to light.  You have a temperature by mouth above 102 F (38.9 C).  You have pain in and around the eye(s).  You start to have vision problems. MAKE SURE YOU:   Understand these instructions.  Will watch your condition.  Will get help right away if you are not doing well or get worse. Document Released: 08/02/2009 Document Revised: 05/07/2011 Document Reviewed: 08/02/2009 [facility]ExitCare Patient Information 2014 [place], [place].  Allergic Rhinitis Allergic rhinitis is when the mucous membranes in the nose respond to allergens. Allergens are particles in the air that cause your body to have an allergic reaction. This causes you to release allergic antibodies. Through a chain of events, these eventually cause you to release histamine into the blood stream. Although meant to protect the body, it is this release of histamine that causes your discomfort, such as frequent sneezing,  congestion, and an itchy, runny nose.  CAUSES  Seasonal allergic rhinitis (hay fever) is caused by pollen allergens that may come from grasses, trees, and weeds. Year-round allergic rhinitis (perennial allergic rhinitis) is caused by allergens such as house dust mites, pet dander, and mold spores.  SYMPTOMS   Nasal stuffiness (congestion).  Itchy, runny nose with sneezing and tearing of the eyes. DIAGNOSIS  Your health care provider can help you determine the allergen or allergens that trigger your symptoms. If you and your health care provider are unable to determine the allergen, skin or blood testing may be used. TREATMENT  Allergic Rhinitis does not have a cure, but it can be controlled by:  Medicines and allergy shots (immunotherapy).  Avoiding the allergen. Hay fever may often be treated with antihistamines in pill or nasal spray forms. Antihistamines block the effects of histamine. There are over-the-counter medicines that may help with nasal congestion and swelling around the eyes. Check with your health care provider before taking or giving this medicine.  If avoiding the allergen or the medicine prescribed do not work, there are many new medicines your health care provider can prescribe. Stronger medicine may be used if initial measures are ineffective. Desensitizing injections can be used if medicine and avoidance does not work. Desensitization is when a patient is given ongoing shots until the body becomes less sensitive to the allergen. Make sure you follow up with your health care provider if problems continue. HOME CARE INSTRUCTIONS It is not possible to completely avoid allergens, but you can reduce your symptoms by  taking steps to limit your exposure to them. It helps to know exactly what you are allergic to so that you can avoid your specific triggers. SEEK MEDICAL CARE IF:   You have a fever.  You develop a cough that does not stop easily (persistent).  You have shortness  of breath.  You start wheezing.  Symptoms interfere with normal daily activities. Document Released: 11/07/2000 Document Revised: 12/03/2012 Document Reviewed: 10/20/2012 [facility] CenterExitCare Patient Information 2014 [place]ExitCare, [place]LL[place].  Sore Throat A sore throat is pain, burning, irritation, or scratchiness of the throat. There is often pain or tenderness when swallowing or talking. A sore throat may be accompanied by other symptoms, such as coughing, sneezing, fever, and swollen neck glands. A sore throat is often the first sign of another sickness, such as a cold, flu, strep throat, or mononucleosis (commonly known as mono). Most sore throats go away without medical treatment. CAUSES  The most common causes of a sore throat include:  A viral infection, such as a cold, flu, or mono.  A bacterial infection, such as strep throat, tonsillitis, or whooping cough.  Seasonal allergies.  Dryness in the air.  Irritants, such as smoke or pollution.  Gastroesophageal reflux disease (GERD). HOME CARE INSTRUCTIONS   Only take over-the-counter medicines as directed by your caregiver.  Drink enough fluids to keep your urine clear or pale yellow.  Rest as needed.  Try using throat sprays, lozenges, or sucking on hard candy to ease any pain (if older than 4 years or as directed).  Sip warm liquids, such as broth, herbal tea, or warm water with honey to relieve pain temporarily. You may also eat or drink cold or frozen liquids such as frozen ice pops.  Gargle with salt water (mix 1 tsp salt with 8 oz of water).  Do not smoke and avoid secondhand smoke.  Put a cool-mist humidifier in your bedroom at night to moisten the air. You can also turn on a hot shower and sit in the bathroom with the door closed for 5 10 minutes. SEEK IMMEDIATE MEDICAL CARE IF:  You have difficulty breathing.  You are unable to swallow fluids, soft foods, or your saliva.  You have increased swelling in the throat.  Your sore  throat does not get better in 7 days.  You have nausea and vomiting.  You have a fever or persistent symptoms for more than 2 3 days.  You have a fever and your symptoms suddenly get worse. MAKE SURE YOU:   Understand these instructions.  Will watch your condition.  Will get help right away if you are not doing well or get worse. Document Released: 03/22/2004 Document Revised: 01/30/2012 Document Reviewed: 10/21/2011 [facility]Exit[facility] Patient Information 2014 [place]ExitCare, MarylandLLC.

## 2013-06-28 NOTE — ED Provider Notes (Signed)
Medical screening examination/treatment/procedure(s) were performed by a resident physician or non-physician practitioner and as the supervising physician I was immediately available for consultation/collaboration.  Mialani Reicks, MD    Tilla Wilborn S Ariely Riddell, MD 06/28/13 0847 

## 2013-06-29 LAB — CULTURE, GROUP A STREP

## 2013-06-29 LAB — POCT RAPID STREP A: Streptococcus, Group A Screen (Direct): NEGATIVE

## 2013-10-23 ENCOUNTER — Ambulatory Visit: Payer: Medicaid Other | Admitting: Internal Medicine

## 2013-10-23 ENCOUNTER — Encounter: Payer: Self-pay | Admitting: Internal Medicine

## 2013-10-23 ENCOUNTER — Ambulatory Visit: Payer: Medicaid Other | Attending: Internal Medicine | Admitting: Internal Medicine

## 2013-10-23 VITALS — BP 140/90 | HR 69 | Temp 98.5°F | Resp 16 | Wt 123.4 lb

## 2013-10-23 DIAGNOSIS — F17200 Nicotine dependence, unspecified, uncomplicated: Secondary | ICD-10-CM | POA: Insufficient documentation

## 2013-10-23 DIAGNOSIS — Z1389 Encounter for screening for other disorder: Secondary | ICD-10-CM | POA: Insufficient documentation

## 2013-10-23 DIAGNOSIS — Z139 Encounter for screening, unspecified: Secondary | ICD-10-CM

## 2013-10-23 DIAGNOSIS — J45909 Unspecified asthma, uncomplicated: Secondary | ICD-10-CM | POA: Insufficient documentation

## 2013-10-23 DIAGNOSIS — R030 Elevated blood-pressure reading, without diagnosis of hypertension: Secondary | ICD-10-CM

## 2013-10-23 DIAGNOSIS — G40909 Epilepsy, unspecified, not intractable, without status epilepticus: Secondary | ICD-10-CM | POA: Diagnosis not present

## 2013-10-23 DIAGNOSIS — Z8249 Family history of ischemic heart disease and other diseases of the circulatory system: Secondary | ICD-10-CM | POA: Insufficient documentation

## 2013-10-23 DIAGNOSIS — IMO0001 Reserved for inherently not codable concepts without codable children: Secondary | ICD-10-CM | POA: Insufficient documentation

## 2013-10-23 DIAGNOSIS — I10 Essential (primary) hypertension: Secondary | ICD-10-CM | POA: Insufficient documentation

## 2013-10-23 LAB — CBC WITH DIFFERENTIAL/PLATELET
BASOS ABS: 0.1 10*3/uL (ref 0.0–0.1)
Basophils Relative: 1 % (ref 0–1)
EOS ABS: 0.1 10*3/uL (ref 0.0–0.7)
EOS PCT: 1 % (ref 0–5)
HCT: 38.6 % (ref 36.0–46.0)
Hemoglobin: 13 g/dL (ref 12.0–15.0)
LYMPHS ABS: 3.1 10*3/uL (ref 0.7–4.0)
Lymphocytes Relative: 45 % (ref 12–46)
MCH: 29.1 pg (ref 26.0–34.0)
MCHC: 33.7 g/dL (ref 30.0–36.0)
MCV: 86.4 fL (ref 78.0–100.0)
Monocytes Absolute: 0.5 10*3/uL (ref 0.1–1.0)
Monocytes Relative: 7 % (ref 3–12)
NEUTROS PCT: 46 % (ref 43–77)
Neutro Abs: 3.1 10*3/uL (ref 1.7–7.7)
PLATELETS: 283 10*3/uL (ref 150–400)
RBC: 4.47 MIL/uL (ref 3.87–5.11)
RDW: 13 % (ref 11.5–15.5)
WBC: 6.8 10*3/uL (ref 4.0–10.5)

## 2013-10-23 LAB — COMPLETE METABOLIC PANEL WITH GFR
ALK PHOS: 41 U/L (ref 39–117)
ALT: 14 U/L (ref 0–35)
AST: 22 U/L (ref 0–37)
Albumin: 4.3 g/dL (ref 3.5–5.2)
BILIRUBIN TOTAL: 0.4 mg/dL (ref 0.2–1.2)
BUN: 5 mg/dL — ABNORMAL LOW (ref 6–23)
CO2: 27 meq/L (ref 19–32)
CREATININE: 0.76 mg/dL (ref 0.50–1.10)
Calcium: 9.7 mg/dL (ref 8.4–10.5)
Chloride: 104 mEq/L (ref 96–112)
GFR, Est African American: >89 mL/min
GFR, Est Non African American: >89 mL/min
Glucose, Bld: 99 mg/dL (ref 70–99)
Potassium: 4.1 mEq/L (ref 3.5–5.3)
SODIUM: 139 meq/L (ref 135–145)
TOTAL PROTEIN: 7 g/dL (ref 6.0–8.3)

## 2013-10-23 LAB — TSH: TSH: 1.929 u[IU]/mL (ref 0.350–4.500)

## 2013-10-23 MED ORDER — HYDROCHLOROTHIAZIDE 25 MG PO TABS: 25.0000 mg | ORAL_TABLET | Freq: Every day | ORAL | Status: DC

## 2013-10-23 MED ORDER — CLONIDINE HCL 0.1 MG PO TABS
0.2000 mg | ORAL_TABLET | Freq: Once | ORAL | Status: AC
  Administered 2013-10-23: 0.2 mg via ORAL

## 2013-10-23 MED ORDER — NICOTINE 21 MG/24HR TD PT24: 21.0000 mg | MEDICATED_PATCH | Freq: Every day | TRANSDERMAL | Status: DC

## 2013-10-23 NOTE — Progress Notes (Signed)
Patient Demographics  Rhonda Kennedy, is a 28 y.o. female  ZOX:[id number]  [id number]  DOB - 1985/12/02  CC:  Chief Complaint  Patient presents with  . Establish Care       HPI: Rhonda Kennedy is a 28 y.o. female here today to establish medical care. Patient has history of asthma, seizure disorder following up with the neurology, as per patient she also took blood pressure medication in the past but currently not on any her blood pressure is elevated recently currently denies any headache dizziness chest and shortness of breath, patient does smoke cigarettes, advised patient to quit smoking she has history of asthma her symptoms are controlled. Patient has No headache, No chest pain, No abdominal pain - No Nausea, No new weakness tingling or numbness, No Cough - SOB.  No Known Allergies Past Medical History  Diagnosis Date  . Hemorrhoids   . History of bronchitis   . Seizures     Onset 28 yo  . Asthma     Diagnosed as child  . Hypertension     2012   Current Outpatient Prescriptions on File Prior to Visit  Medication Sig Dispense Refill  . levETIRAcetam (KEPPRA) 500 MG tablet Take 1 tablet (500 mg total) by mouth daily.  30 tablet  11  . albuterol (PROVENTIL HFA;VENTOLIN HFA) 108 (90 BASE) MCG/ACT inhaler Inhale 2 puffs into the lungs every 4 (four) hours as needed for wheezing.  1 Inhaler  0  . erythromycin ophthalmic ointment Place a 1/2 inch ribbon of ointment into the right lower eyelid.  1 g  0  . fexofenadine (ALLEGRA) 60 MG tablet Take 1 tablet (60 mg total) by mouth 2 (two) times daily.  30 tablet  1  . fluticasone (FLONASE) 50 MCG/ACT nasal spray Place 2 sprays into both nostrils daily.  16 g  0  . ketotifen (ZADITOR) 0.025 % ophthalmic solution Place 1 drop into both eyes 2 (two) times daily.  5 mL  0  . ranitidine (ZANTAC) 75 MG tablet Take 75 mg by mouth daily as needed. Acid reflux       Current Facility-Administered Medications on File Prior to Visit    Medication Dose Route Frequency Provider Last Rate Last Dose  . azithromycin (ZITHROMAX) tablet 1,000 mg  1,000 mg Oral Once Aviva Signs, CNM       Family History  Problem Relation Age of Onset  . Hypertension Mother   . Hyperlipidemia Mother   . Hypertension Sister   . Other Sister     anxiety  . Cancer Maternal Grandmother    History   Social History  . Marital Status: Single    Spouse Name: N/A    Number of Children: N/A  . Years of Education: N/A   Occupational History  . Not on file.   Social History Main Topics  . Smoking status: Current Every Day Smoker -- 0.25 packs/day for 10 years    Types: Cigarettes  . Smokeless tobacco: Never Used  . Alcohol Use: 1.5 oz/week    3 drink(s) per week     Comment: per week  . Drug Use: No     Comment: marijuana  . Sexual Activity: Yes    Birth Control/ Protection: Pill   Other Topics Concern  . Not on file   Social History Narrative  . No narrative on file    Review of Systems: Constitutional: Negative for fever, chills, diaphoresis, activity change, appetite change and fatigue. HENT:  Negative for ear pain, nosebleeds, congestion, facial swelling, rhinorrhea, neck pain, neck stiffness and ear discharge.  Eyes: Negative for pain, discharge, redness, itching and visual disturbance. Respiratory: Negative for cough, choking, chest tightness, shortness of breath, wheezing and stridor.  Cardiovascular: Negative for chest pain, palpitations and leg swelling. Gastrointestinal: Negative for abdominal distention. Genitourinary: Negative for dysuria, urgency, frequency, hematuria, flank pain, decreased urine volume, difficulty urinating and dyspareunia.  Musculoskeletal: Negative for back pain, joint swelling, arthralgia and gait problem. Neurological: Negative for dizziness, tremors, seizures, syncope, facial asymmetry, speech difficulty, weakness, light-headedness, numbness and headaches.  Hematological: Negative for  adenopathy. Does not bruise/bleed easily. Psychiatric/Behavioral: Negative for hallucinations, behavioral problems, confusion, dysphoric mood, decreased concentration and agitation.    Objective:   Filed Vitals:   10/23/13 1206  BP: 140/90  Pulse:   Temp:   Resp:     Physical Exam: Constitutional: Patient appears well-developed and well-nourished. No distress. HENT: Normocephalic, atraumatic, External right and left ear normal. Oropharynx is clear and moist.  Eyes: Conjunctivae and EOM are normal. PERRLA, no scleral icterus. Neck: Normal ROM. Neck supple. No JVD. No tracheal deviation. No thyromegaly. CVS: RRR, S1/S2 +, no murmurs, no gallops, no carotid bruit.  Pulmonary: Effort and breath sounds normal, no stridor, rhonchi, wheezes, rales.  Abdominal: Soft. BS +, no distension, tenderness, rebound or guarding.  Musculoskeletal: Normal range of motion. No edema and no tenderness.  Neuro: Alert. Normal reflexes, muscle tone coordination. No cranial nerve deficit. Skin: Skin is warm and dry. No rash noted. Not diaphoretic. No erythema. No pallor. Psychiatric: Normal mood and affect. Behavior, judgment, thought content normal.  Lab Results  Component Value Date   WBC 6.6 10/08/2011   HGB 13.4 10/08/2011   HCT 41.1 10/08/2011   MCV 90.1 10/08/2011   PLT 235 10/08/2011   Lab Results  Component Value Date   CREATININE 0.72 12/14/2010   BUN 8 12/14/2010   NA 134* 12/14/2010   K 3.8 12/14/2010   CL 101 12/14/2010   CO2 22 12/14/2010    No results found for this basename: HGBA1C   Lipid Panel  No results found for this basename: chol, trig, hdl, cholhdl, vldl, ldlcalc       Assessment and plan:   1. Elevated blood pressure  - cloNIDine (CATAPRES) tablet 0.2 mg; Take 2 tablets (0.2 mg total) by mouth once. Repeat manual blood pressure is 140/90  2. Unspecified asthma(493.90) Symptoms are stable albuterol when necessary  3. Essential hypertension, benign I have advised  patient for DASH diet, started her on hydrochlorothiazide 25 mg daily. - hydrochlorothiazide (HYDRODIURIL) 25 MG tablet; Take 1 tablet (25 mg total) by mouth daily.  Dispense: 90 tablet; Refill: 3  4. Seizure disorder Patient is on Keppra and following up with neurology.  5. Smoking  - nicotine (NICODERM CQ) 21 mg/24hr patch; Place 1 patch (21 mg total) onto the skin daily.  Dispense: 28 patch; Refill: 0  6. Screening We'll do baseline blood work. - CBC with Differential - COMPLETE METABOLIC PANEL WITH GFR - TSH - Vit D  25 hydroxy (rtn osteoporosis monitoring)    Return in about 3 months (around 01/23/2014) for hypertension, BP check in 2 weeks/Nurse Visit.     Doris Cheadle, MD

## 2013-10-23 NOTE — Patient Instructions (Signed)
DASH Eating Plan °DASH stands for "Dietary Approaches to Stop Hypertension." The DASH eating plan is a healthy eating plan that has been shown to reduce high blood pressure (hypertension). Additional health benefits may include reducing the risk of type 2 diabetes mellitus, heart disease, and stroke. The DASH eating plan may also help with weight loss. °WHAT DO I NEED TO KNOW ABOUT THE DASH EATING PLAN? °For the DASH eating plan, you will follow these general guidelines: °· Choose foods with a percent daily value for sodium of less than 5% (as listed on the food label). °· Use salt-free seasonings or herbs instead of table salt or sea salt. °· Check with your health care provider or pharmacist before using salt substitutes. °· Eat lower-sodium products, often labeled as "lower sodium" or "no salt added." °· Eat fresh foods. °· Eat more vegetables, fruits, and low-fat dairy products. °· Choose whole grains. Look for the word "whole" as the first word in the ingredient list. °· Choose fish and skinless chicken or turkey more often than red meat. Limit fish, poultry, and meat to 6 oz (170 g) each day. °· Limit sweets, desserts, sugars, and sugary drinks. °· Choose heart-healthy fats. °· Limit cheese to 1 oz (28 g) per day. °· Eat more home-cooked food and less restaurant, buffet, and fast food. °· Limit fried foods. °· Cook foods using methods other than frying. °· Limit canned vegetables. If you do use them, rinse them well to decrease the sodium. °· When eating at a restaurant, ask that your food be prepared with less salt, or no salt if possible. °WHAT FOODS CAN I EAT? °Seek help from a dietitian for individual calorie needs. °Grains °Whole grain or whole wheat bread. Brown rice. Whole grain or whole wheat pasta. Quinoa, bulgur, and whole grain cereals. Low-sodium cereals. Corn or whole wheat flour tortillas. Whole grain cornbread. Whole grain crackers. Low-sodium crackers. °Vegetables °Fresh or frozen vegetables  (raw, steamed, roasted, or grilled). Low-sodium or reduced-sodium tomato and vegetable juices. Low-sodium or reduced-sodium tomato sauce and paste. Low-sodium or reduced-sodium canned vegetables.  °Fruits °All fresh, canned (in natural juice), or frozen fruits. °Meat and Other Protein Products °Ground beef (85% or leaner), grass-fed beef, or beef trimmed of fat. Skinless chicken or turkey. Ground chicken or turkey. Pork trimmed of fat. All fish and seafood. Eggs. Dried beans, peas, or lentils. Unsalted nuts and seeds. Unsalted canned beans. °Dairy °Low-fat dairy products, such as skim or 1% milk, 2% or reduced-fat cheeses, low-fat ricotta or cottage cheese, or plain low-fat yogurt. Low-sodium or reduced-sodium cheeses. °Fats and Oils °Tub margarines without trans fats. Light or reduced-fat mayonnaise and salad dressings (reduced sodium). Avocado. Safflower, olive, or canola oils. Natural peanut or almond butter. °Other °Unsalted popcorn and pretzels. °The items listed above may not be a complete list of recommended foods or beverages. Contact your dietitian for more options. °WHAT FOODS ARE NOT RECOMMENDED? °Grains °White bread. White pasta. White rice. Refined cornbread. Bagels and croissants. Crackers that contain trans fat. °Vegetables °Creamed or fried vegetables. Vegetables in a cheese sauce. Regular canned vegetables. Regular canned tomato sauce and paste. Regular tomato and vegetable juices. °Fruits °Dried fruits. Canned fruit in light or heavy syrup. Fruit juice. °Meat and Other Protein Products °Fatty cuts of meat. Ribs, chicken wings, bacon, sausage, bologna, salami, chitterlings, fatback, hot dogs, bratwurst, and packaged luncheon meats. Salted nuts and seeds. Canned beans with salt. °Dairy °Whole or 2% milk, cream, half-and-half, and cream cheese. Whole-fat or sweetened yogurt. Full-fat   cheeses or blue cheese. Nondairy creamers and whipped toppings. Processed cheese, cheese spreads, or cheese  curds. °Condiments °Onion and garlic salt, seasoned salt, table salt, and sea salt. Canned and packaged gravies. Worcestershire sauce. Tartar sauce. Barbecue sauce. Teriyaki sauce. Soy sauce, including reduced sodium. Steak sauce. Fish sauce. Oyster sauce. Cocktail sauce. Horseradish. Ketchup and mustard. Meat flavorings and tenderizers. Bouillon cubes. Hot sauce. Tabasco sauce. Marinades. Taco seasonings. Relishes. °Fats and Oils °Butter, stick margarine, lard, shortening, ghee, and bacon fat. Coconut, palm kernel, or palm oils. Regular salad dressings. °Other °Pickles and olives. Salted popcorn and pretzels. °The items listed above may not be a complete list of foods and beverages to avoid. Contact your dietitian for more information. °WHERE CAN I FIND MORE INFORMATION? °National [employer], [employer], [employer]: www.nhlbi.nih.gov/health/health-topics/topics/dash/ °Document Released: 02/01/2011 Document Revised: 06/29/2013 Document Reviewed: 12/17/2012 °ExitCare® Patient Information ©2015 [employer]. This information is not intended to replace advice given to you by your health care provider. Make sure you discuss any questions you have with your health care provider. ° °

## 2013-10-23 NOTE — Progress Notes (Signed)
Patient here to establish care Presents in office with elevated blood pressure

## 2013-10-24 LAB — VITAMIN D 25 HYDROXY (VIT D DEFICIENCY, FRACTURES): VIT D 25 HYDROXY: 31 ng/mL (ref 30–89)

## 2013-10-26 ENCOUNTER — Telehealth: Payer: Self-pay | Admitting: *Deleted

## 2013-10-26 NOTE — Telephone Encounter (Signed)
Message copied by Raynelle Chary on Mon Oct 26, 2013  3:56 PM ------      Message from: Doris Cheadle      Created: Mon Oct 26, 2013 11:40 AM       Call and let the Patient know that blood work is normal.       ------

## 2013-10-26 NOTE — Telephone Encounter (Signed)
Pt is aware of her lab results. Pt stated that she was breaking out in maybe bug bites. I instructed the pt to come in tomorrow to see a nurse about these bumps.

## 2013-11-06 ENCOUNTER — Ambulatory Visit: Payer: Medicaid Other | Attending: Internal Medicine

## 2013-11-06 NOTE — Patient Instructions (Signed)
BP improving, maintening a good diet and exercise Taking medication as prescribe

## 2013-11-09 ENCOUNTER — Telehealth: Payer: Self-pay | Admitting: *Deleted

## 2013-11-16 ENCOUNTER — Encounter (HOSPITAL_COMMUNITY): Payer: Self-pay | Admitting: Emergency Medicine

## 2013-11-16 ENCOUNTER — Emergency Department (INDEPENDENT_AMBULATORY_CARE_PROVIDER_SITE_OTHER)
Admission: EM | Admit: 2013-11-16 | Discharge: 2013-11-16 | Disposition: A | Discharge status: Home / Self Care | Payer: Medicaid Other | Source: Home / Self Care | Attending: Family Medicine | Admitting: Family Medicine

## 2013-11-16 DIAGNOSIS — B86 Scabies: Secondary | ICD-10-CM

## 2013-11-16 MED ORDER — PERMETHRIN 0.25 % LIQD
Status: AC
  Filled 2013-11-16: qty 147.86

## 2013-11-16 MED ORDER — PERMETHRIN 5 % EX CREA: TOPICAL_CREAM | CUTANEOUS | Status: DC

## 2013-11-16 NOTE — ED Notes (Signed)
Patient and child are being treated in the same treatment room

## 2013-11-16 NOTE — ED Notes (Signed)
Reports rash for 3 weeks, rash itches

## 2013-11-16 NOTE — ED Provider Notes (Addendum)
CSN: [id number]     Arrival date & time 11/16/13  1511 History   None    Chief Complaint  Patient presents with  . Rash   (Consider location/radiation/quality/duration/timing/severity/associated sxs/prior Treatment) Patient is a 28 y.o. female presenting with rash. The history is provided by the patient.  Rash Location:  Shoulder/arm and finger Quality: dryness and itchiness   Severity:  Mild Onset quality:  Gradual Duration:  3 weeks Progression:  Spreading Chronicity:  New Context: insect bite/sting   Ineffective treatments:  Topical steroids Associated symptoms: no fatigue, no fever and no nausea     Past Medical History  Diagnosis Date  . Hemorrhoids   . History of bronchitis   . Seizures     Onset 28 yo  . Asthma     Diagnosed as child  . Hypertension     2012   Past Surgical History  Procedure Laterality Date  . Birth control  07/2007    Mirena 100   Family History  Problem Relation Age of Onset  . Hypertension Mother   . Hyperlipidemia Mother   . Hypertension Sister   . Other Sister     anxiety  . Cancer Maternal Grandmother    History  Substance Use Topics  . Smoking status: Current Every Day Smoker -- 0.25 packs/day for 10 years    Types: Cigarettes  . Smokeless tobacco: Never Used  . Alcohol Use: 1.5 oz/week    3 drink(s) per week     Comment: per week   OB History   Grav Para Term Preterm Abortions TAB SAB Ect Mult Living   0 3 3 0 0 0 2     Review of Systems  Constitutional: Negative.  Negative for fever and fatigue.  Gastrointestinal: Negative for nausea.  Skin: Positive for rash.    Allergies  Review of patient's allergies indicates no known allergies.  Home Medications   Prior to Admission medications   Medication Sig Start Date End Date Taking? Authorizing Provider  albuterol (PROVENTIL HFA;VENTOLIN HFA) 108 (90 BASE) MCG/ACT inhaler Inhale 2 puffs into the lungs every 4 (four) hours as needed for wheezing. 07/27/12    Graylon Good, PA-C  erythromycin ophthalmic ointment Place a 1/2 inch ribbon of ointment into the right lower eyelid. 06/26/13   Hayden Rasmussen, NP  fexofenadine (ALLEGRA) 60 MG tablet Take 1 tablet (60 mg total) by mouth 2 (two) times daily. 06/26/13   Hayden Rasmussen, NP  fluticasone (FLONASE) 50 MCG/ACT nasal spray Place 2 sprays into both nostrils daily. 06/26/13   Hayden Rasmussen, NP  hydrochlorothiazide (HYDRODIURIL) 25 MG tablet Take 1 tablet (25 mg total) by mouth daily. 10/23/13   Doris Cheadle, MD  ketotifen (ZADITOR) 0.025 % ophthalmic solution Place 1 drop into both eyes 2 (two) times daily. 06/26/13   Hayden Rasmussen, NP  levETIRAcetam (KEPPRA) 500 MG tablet Take 1 tablet (500 mg total) by mouth daily. 12/09/12   Nilda Riggs, NP  nicotine (NICODERM CQ) 21 mg/24hr patch Place 1 patch (21 mg total) onto the skin daily. 10/23/13   Doris Cheadle, MD  permethrin (ELIMITE) 5 % cream Use as directed on bottle, may repeat in 1 week. 11/16/13   Linna Hoff, MD  ranitidine (ZANTAC) 75 MG tablet Take 75 mg by mouth daily as needed. Acid reflux    Historical Provider, MD   BP 119/81  Pulse 72  Temp(Src) 97.9 F (36.6 C) (Oral)  Resp 14  SpO2 100% Physical  Exam  Nursing note and vitals reviewed. Constitutional: She is oriented to person, place, and time. She appears well-developed and well-nourished. No distress.  Neurological: She is alert and oriented to person, place, and time.  Skin: Skin is warm and dry. Rash noted.  Scattered papular lesions, pruritic on arms and hands    ED Course  Procedures (including critical care time) Labs Review Labs Reviewed - No data to display  Imaging Review No results found.   MDM   1. Scabies        Linna Hoff, MD 11/16/13 1722  Linna Hoff, MD 11/20/13 [phone]

## 2013-12-09 ENCOUNTER — Ambulatory Visit (INDEPENDENT_AMBULATORY_CARE_PROVIDER_SITE_OTHER): Payer: Medicaid Other | Admitting: Adult Health

## 2013-12-09 ENCOUNTER — Encounter: Payer: Self-pay | Admitting: Adult Health

## 2013-12-09 VITALS — BP 102/67 | HR 99 | Ht 64.0 in | Wt 122.0 lb

## 2013-12-09 DIAGNOSIS — G40309 Generalized idiopathic epilepsy and epileptic syndromes, not intractable, without status epilepticus: Secondary | ICD-10-CM

## 2013-12-09 MED ORDER — LEVETIRACETAM 500 MG PO TABS: 500.0000 mg | ORAL_TABLET | Freq: Every day | ORAL | Status: DC

## 2013-12-09 NOTE — Patient Instructions (Signed)
Seizure, Adult A seizure means there is unusual activity in the brain. A seizure can cause changes in attention or behavior. Seizures often cause shaking (convulsions). Seizures often last from 30 seconds to 2 minutes. HOME CARE   If you are given medicines, take them exactly as told by your doctor.  Keep all doctor visits as told.  Do not swim or drive until your doctor says it is okay.  Teach others what to do if you have a seizure. They should:  Lay you on the ground.  Put a cushion under your head.  Loosen any tight clothing around your neck.  Turn you on your side.  Stay with you until you get better. GET HELP RIGHT AWAY IF:   The seizure lasts longer than 2 to 5 minutes.  The seizure is very bad.  The person does not wake up after the seizure.  The person's attention or behavior changes. Drive the person to the emergency room or call your local emergency services (911 in [place] MAKE SURE YOU:   Understand these instructions.  Will watch your condition.  Will get help right away if you are not doing well or get worse. Document Released: 08/01/2007 Document Revised: 05/07/2011 Document Reviewed: 01/31/2011 ExitCare Patient Information 2015 [employer]. This information is not intended to replace advice given to you by your health care provider. Make sure you discuss any questions you have with your health care provider.  

## 2013-12-09 NOTE — Progress Notes (Signed)
I have read the note, and I agree with the clinical assessment and plan.  WILLIS,CHARLES KEITH   

## 2013-12-09 NOTE — Progress Notes (Signed)
PATIENT: Rhonda Kennedy DOB: [birth year]  REASON FOR VISIT: follow up HISTORY FROM: patient  HISTORY OF PRESENT ILLNESS: Rhonda Kennedy is a 28 year old female with a history of seizures. She returns today for follow-up. She reports that she has not had any seizures since her last visit. She is currently taking Keppra and tolerating it well. She is able to operate a motor vehicle without difficulty. She is currently in school for business. She states that school is going well for her. She states that she is sleeping well at night. She states that since we saw her last she was diagnosed with HTN and is being treated with Hydrochlorothiazide. No new neurological complaints.   HISTORY (CM);  28 year old right-handed black female with a history of seizures. She was last seen 12/12/11. Last seizure activity occurred in January 2013 after missing doses of Keppra. This occurred in her sleep. The patient had a MRI of the brain that was unremarkable, and has had an EEG study that was also normal. The patient does report some drowsiness, and she admits to not getting enough sleep The patient has not had any further seizures. He is currently not working but is in school for a business degree. No new neurologic complaints   REVIEW OF SYSTEMS: Full 14 system review of systems performed and notable only for:  Constitutional: fatigue Eyes: N/A Ear/Nose/Throat: N/A  Skin: N/A  Cardiovascular: N/A  Respiratory: N/A  Gastrointestinal: diarrhea Genitourinary: N/A Hematology/Lymphatic: N/A  Endocrine: N/A Musculoskeletal:N/A  Allergy/Immunology: N/A  Neurological: memory loss, headache, seizure Psychiatric: nervous/anxious Sleep: N/A   ALLERGIES: No Known Allergies  HOME MEDICATIONS: Outpatient Prescriptions Prior to Visit  Medication Sig Dispense Refill  . albuterol (PROVENTIL HFA;VENTOLIN HFA) 108 (90 BASE) MCG/ACT inhaler Inhale 2 puffs into the lungs every 4 (four) hours as needed for wheezing.   1 Inhaler  0  . fexofenadine (ALLEGRA) 60 MG tablet Take 1 tablet (60 mg total) by mouth 2 (two) times daily.  30 tablet  1  . hydrochlorothiazide (HYDRODIURIL) 25 MG tablet Take 1 tablet (25 mg total) by mouth daily.  90 tablet  3  . levETIRAcetam (KEPPRA) 500 MG tablet Take 1 tablet (500 mg total) by mouth daily.  30 tablet  11  . nicotine (NICODERM CQ) 21 mg/24hr patch Place 1 patch (21 mg total) onto the skin daily.  28 patch  0  . permethrin (ELIMITE) 5 % cream Use as directed on bottle, may repeat in 1 week.  60 g  1  . erythromycin ophthalmic ointment Place a 1/2 inch ribbon of ointment into the right lower eyelid.  1 g  0  . fluticasone (FLONASE) 50 MCG/ACT nasal spray Place 2 sprays into both nostrils daily.  16 g  0  . ketotifen (ZADITOR) 0.025 % ophthalmic solution Place 1 drop into both eyes 2 (two) times daily.  5 mL  0  . ranitidine (ZANTAC) 75 MG tablet Take 75 mg by mouth daily as needed. Acid reflux       Facility-Administered Medications Prior to Visit  Medication Dose Route Frequency Provider Last Rate Last Dose  . azithromycin (ZITHROMAX) tablet 1,000 mg  1,000 mg Oral Once Aviva SignsMarie L Williams, CNM        PAST MEDICAL HISTORY: Past Medical History  Diagnosis Date  . Hemorrhoids   . History of bronchitis   . Seizures     Onset 28 yo  . Asthma     Diagnosed as child  .  Hypertension     2012    PAST SURGICAL HISTORY: Past Surgical History  Procedure Laterality Date  . Birth control  07/2007    Mirena 100    FAMILY HISTORY: Family History  Problem Relation Age of Onset  . Hypertension Mother   . Hyperlipidemia Mother   . Hypertension Sister   . Other Sister     anxiety  . Cancer Maternal Grandmother     SOCIAL HISTORY: History   Social History  . Marital Status: Single    Spouse Name: N/A    Number of Children: N/A  . Years of Education: N/A   Occupational History  . Not on file.   Social History Main Topics  . Smoking status: Current Every Day  Smoker -- 0.25 packs/day for 10 years    Types: Cigarettes  . Smokeless tobacco: Never Used  . Alcohol Use: 1.5 oz/week    3 drink(s) per week     Comment: per week  . Drug Use: No     Comment: marijuana  . Sexual Activity: Yes    Birth Control/ Protection: Pill   Other Topics Concern  . Not on file   Social History Narrative  . No narrative on file      PHYSICAL EXAM  Filed Vitals:   12/09/13 1347  BP: 102/67  Pulse: 99  Height: 5\' 4"  (1.626 m)  Weight: 122 lb (55.339 kg)   Body mass index is 20.93 kg/(m^2).  Generalized: Well developed, in no acute distress   Neurological examination  Mentation: Alert oriented to time, place, history taking. Follows all commands speech and language fluent Cranial nerve II-XII: Pupils were equal round reactive to light. Extraocular movements were full, visual field were full on confrontational test. Facial sensation and strength were normal. Uvula tongue midline. Head turning and shoulder shrug  were normal and symmetric. Motor: The motor testing reveals 5 over 5 strength of all 4 extremities. Good symmetric motor tone is noted throughout.  Sensory: Sensory testing is intact to soft touch on all 4 extremities. No evidence of extinction is noted.  Coordination: Cerebellar testing reveals good finger-nose-finger and heel-to-shin bilaterally.  Gait and station: Gait is normal. Tandem gait is normal. Romberg is negative. No drift is seen.  Reflexes: Deep tendon reflexes are symmetric and normal bilaterally.   DIAGNOSTIC DATA (LABS, IMAGING, TESTING) - I reviewed patient records, labs, notes, testing and imaging myself where available.  Lab Results  Component Value Date   WBC 6.8 10/23/2013   HGB 13.0 10/23/2013   HCT 38.6 10/23/2013   MCV 86.4 10/23/2013   PLT 283 10/23/2013      Component Value Date/Time   NA 139 10/23/2013 1207   K 4.1 10/23/2013 1207   CL 104 10/23/2013 1207   CO2 27 10/23/2013 1207   GLUCOSE 99 10/23/2013 1207   BUN  5* 10/23/2013 1207   CREATININE 0.76 10/23/2013 1207   CREATININE 0.72 12/14/2010 1738   CALCIUM 9.7 10/23/2013 1207   PROT 7.0 10/23/2013 1207   ALBUMIN 4.3 10/23/2013 1207   AST 22 10/23/2013 1207   ALT 14 10/23/2013 1207   ALKPHOS 41 10/23/2013 1207   BILITOT 0.4 10/23/2013 1207   GFRNONAA >89 10/23/2013 1207   GFRNONAA >90 12/14/2010 1738   GFRAA >89 10/23/2013 1207   GFRAA >90 12/14/2010 1738    Lab Results  Component Value Date   TSH 1.929 10/23/2013      ASSESSMENT AND PLAN 28 y.o. year old female  has a past medical history of Hemorrhoids; History of bronchitis; Seizures; Asthma; and Hypertension. here with:  1. Seizures  Seizures have been under good control with Keppra. She reports no additional seizures since the last visit. She should continue Keppra, I will refill today. If she has any additional seizures she should let us know. Otherwise she will follow-up in 1 year or sooner if needed.   Butch Penny, MSN, NP-C 12/09/2013, 1:52 PM [facility] [address], [address] [place], [place] [address] [phone]  Note: This document was prepared with digital dictation and possible smart phrase technology. Any transcriptional errors that result from this process are unintentional.

## 2013-12-21 ENCOUNTER — Inpatient Hospital Stay (HOSPITAL_COMMUNITY)
Admission: AD | Admit: 2013-12-21 | Discharge: 2013-12-21 | Disposition: A | Discharge status: Home / Self Care | Payer: Medicaid Other | Source: Ambulatory Visit | Attending: Obstetrics and Gynecology | Admitting: Obstetrics and Gynecology

## 2013-12-21 ENCOUNTER — Inpatient Hospital Stay (HOSPITAL_COMMUNITY): Payer: Medicaid Other

## 2013-12-21 ENCOUNTER — Encounter (HOSPITAL_COMMUNITY): Payer: Self-pay | Admitting: Emergency Medicine

## 2013-12-21 ENCOUNTER — Encounter (HOSPITAL_COMMUNITY): Payer: Self-pay | Admitting: *Deleted

## 2013-12-21 ENCOUNTER — Emergency Department (INDEPENDENT_AMBULATORY_CARE_PROVIDER_SITE_OTHER)
Admission: EM | Admit: 2013-12-21 | Discharge: 2013-12-21 | Disposition: A | Discharge status: Other Acute Inpatient Hospital | Payer: Medicaid Other | Source: Home / Self Care | Attending: Family Medicine | Admitting: Family Medicine

## 2013-12-21 DIAGNOSIS — Z3490 Encounter for supervision of normal pregnancy, unspecified, unspecified trimester: Secondary | ICD-10-CM

## 2013-12-21 DIAGNOSIS — F17210 Nicotine dependence, cigarettes, uncomplicated: Secondary | ICD-10-CM | POA: Insufficient documentation

## 2013-12-21 DIAGNOSIS — O209 Hemorrhage in early pregnancy, unspecified: Secondary | ICD-10-CM

## 2013-12-21 DIAGNOSIS — O9989 Other specified diseases and conditions complicating pregnancy, childbirth and the puerperium: Secondary | ICD-10-CM | POA: Diagnosis not present

## 2013-12-21 DIAGNOSIS — R1084 Generalized abdominal pain: Secondary | ICD-10-CM

## 2013-12-21 DIAGNOSIS — R109 Unspecified abdominal pain: Secondary | ICD-10-CM | POA: Insufficient documentation

## 2013-12-21 DIAGNOSIS — Z3201 Encounter for pregnancy test, result positive: Secondary | ICD-10-CM

## 2013-12-21 LAB — WET PREP, GENITAL
Trich, Wet Prep: NONE SEEN
YEAST WET PREP: NONE SEEN

## 2013-12-21 LAB — POCT URINALYSIS DIP (DEVICE)
BILIRUBIN URINE: NEGATIVE
GLUCOSE, UA: NEGATIVE mg/dL
HGB URINE DIPSTICK: NEGATIVE
KETONES UR: NEGATIVE mg/dL
Leukocytes, UA: NEGATIVE
Nitrite: NEGATIVE
Protein, ur: NEGATIVE mg/dL
SPECIFIC GRAVITY, URINE: 1.015 (ref 1.005–1.030)
UROBILINOGEN UA: 0.2 mg/dL (ref 0.0–1.0)
pH: 7.5 (ref 5.0–8.0)

## 2013-12-21 LAB — CBC
HEMATOCRIT: 34.7 % — AB (ref 36.0–46.0)
HEMOGLOBIN: 12.1 g/dL (ref 12.0–15.0)
MCH: 29.8 pg (ref 26.0–34.0)
MCHC: 34.9 g/dL (ref 30.0–36.0)
MCV: 85.5 fL (ref 78.0–100.0)
Platelets: 217 10*3/uL (ref 150–400)
RBC: 4.06 MIL/uL (ref 3.87–5.11)
RDW: 12 % (ref 11.5–15.5)
WBC: 7.7 10*3/uL (ref 4.0–10.5)

## 2013-12-21 LAB — HIV ANTIBODY (ROUTINE TESTING W REFLEX): HIV 1&2 Ab, 4th Generation: NONREACTIVE

## 2013-12-21 LAB — HCG, QUANTITATIVE, PREGNANCY: HCG, BETA CHAIN, QUANT, S: 99556 m[IU]/mL — AB (ref <5)

## 2013-12-21 LAB — POCT PREGNANCY, URINE: PREG TEST UR: POSITIVE — AB

## 2013-12-21 MED ORDER — SODIUM CHLORIDE 0.9 % IV SOLN
Freq: Once | INTRAVENOUS | Status: AC
Start: 2013-12-21 — End: 2013-12-21
  Administered 2013-12-21: 15:00:00 via INTRAVENOUS

## 2013-12-21 NOTE — MAU Note (Signed)
Patient presents via EMS stretcher with complaint of abdominal pain. Patient states she has been experiencing abdominal pain X 2 weeks since her LMP that has worsened over the last 4 days. She went to Urgent Care and her POCT was positive. Transported to MAU.

## 2013-12-21 NOTE — ED Provider Notes (Signed)
Medical screening examination/treatment/procedure(s) were performed by resident physician or non-physician practitioner and as supervising physician I was immediately available for consultation/collaboration.   Taksh Hjort DOUGLAS MD.   Byanca Kasper D Mackensie Pilson, MD 12/21/13 1730 

## 2013-12-21 NOTE — ED Notes (Signed)
Scant menses last month, c/o general abdominal pain, cramping x 4 days

## 2013-12-21 NOTE — Discharge Instructions (Signed)
Vaginal Bleeding During Pregnancy, First Trimester  A small amount of bleeding (spotting) from the vagina is relatively common in early pregnancy. It usually stops on its own. Various things may cause bleeding or spotting in early pregnancy. Some bleeding may be related to the pregnancy, and some may not. In most cases, the bleeding is normal and is not a problem. However, bleeding can also be a sign of something serious. Be sure to tell your health care provider about any vaginal bleeding right away.  Some possible causes of vaginal bleeding during the first trimester include:  · Infection or inflammation of the cervix.  · Growths (polyps) on the cervix.  · Miscarriage or threatened miscarriage.  · Pregnancy tissue has developed outside of the uterus and in a fallopian tube (tubal pregnancy).  · Tiny cysts have developed in the uterus instead of pregnancy tissue (molar pregnancy).  HOME CARE INSTRUCTIONS   Watch your condition for any changes. The following actions may help to lessen any discomfort you are feeling:  · Follow your health care provider's instructions for limiting your activity. If your health care provider orders bed rest, you may need to stay in bed and only get up to use the bathroom. However, your health care provider may allow you to continue light activity.  · If needed, make plans for someone to help with your regular activities and responsibilities while you are on bed rest.  · Keep track of the number of pads you use each day, how often you change pads, and how soaked (saturated) they are. Write this down.  · Do not use tampons. Do not douche.  · Do not have sexual intercourse or orgasms until approved by your health care provider.  · If you pass any tissue from your vagina, save the tissue so you can show it to your health care provider.  · Only take over-the-counter or prescription medicines as directed by your health care provider.  · Do not take aspirin because it can make you  bleed.  · Keep all follow-up appointments as directed by your health care provider.  SEEK MEDICAL CARE IF:  · You have any vaginal bleeding during any part of your pregnancy.  · You have cramps or labor pains.  · You have a fever, not controlled by medicine.  SEEK IMMEDIATE MEDICAL CARE IF:   · You have severe cramps in your back or belly (abdomen).  · You pass large clots or tissue from your vagina.  · Your bleeding increases.  · You feel light-headed or weak, or you have fainting episodes.  · You have chills.  · You are leaking fluid or have a gush of fluid from your vagina.  · You pass out while having a bowel movement.  MAKE SURE YOU:  · Understand these instructions.  · Will watch your condition.  · Will get help right away if you are not doing well or get worse.  Document Released: 11/22/2004 Document Revised: 02/17/2013 Document Reviewed: 10/20/2012  ExitCare® Patient Information ©2015 [employer]. This information is not intended to replace advice given to you by your health care provider. Make sure you discuss any questions you have with your health care provider.

## 2013-12-21 NOTE — MAU Provider Note (Signed)
History     CSN: [id number]  Arrival date and time: 12/21/13 1534   None     Chief Complaint  Patient presents with  . Abdominal Pain   HPI This is a 28 y.o. at 3063w5d who presents via EMS from Urgent Care with c/o abdominal cramping. UPT + at Urgent care. Sent here to rule out ectopic.   RN Note:  Patient presents via EMS stretcher with complaint of abdominal pain. Patient states she has been experiencing abdominal pain X 2 weeks since her LMP that has worsened over the last 4 days. She went to Urgent Care and her POCT was positive. Transported to MAU.       OB History   Grav Para Term Preterm Abortions TAB SAB Ect Mult Living   7 2 2  0 3 3 0 0 0 2      Past Medical History  Diagnosis Date  . Hemorrhoids   . History of bronchitis   . Seizures     Onset 28 yo  . Asthma     Diagnosed as child  . Hypertension     2012    Past Surgical History  Procedure Laterality Date  . Birth control  07/2007    Mirena 100    Family History  Problem Relation Age of Onset  . Hypertension Mother   . Hyperlipidemia Mother   . Hypertension Sister   . Other Sister     anxiety  . Cancer Maternal Grandmother     History  Substance Use Topics  . Smoking status: Current Every Day Smoker -- 0.25 packs/day for 10 years    Types: Cigarettes  . Smokeless tobacco: Never Used  . Alcohol Use: 1.5 oz/week    3 drink(s) per week     Comment: per week    Allergies: No Known Allergies  Facility-administered medications prior to admission  Medication Dose Route Frequency Provider Last Rate Last Dose  . azithromycin (ZITHROMAX) tablet 1,000 mg  1,000 mg Oral Once Aviva SignsMarie L Jenniferlynn Saad, CNM       Prescriptions prior to admission  Medication Sig Dispense Refill  . albuterol (PROVENTIL HFA;VENTOLIN HFA) 108 (90 BASE) MCG/ACT inhaler Inhale 2 puffs into the lungs every 4 (four) hours as needed for wheezing.  1 Inhaler  0  . fexofenadine (ALLEGRA) 60 MG tablet Take 1 tablet (60 mg total)  by mouth 2 (two) times daily.  30 tablet  1  . hydrochlorothiazide (HYDRODIURIL) 25 MG tablet Take 1 tablet (25 mg total) by mouth daily.  90 tablet  3  . levETIRAcetam (KEPPRA) 500 MG tablet Take 1 tablet (500 mg total) by mouth daily.  30 tablet  11  . nicotine (NICODERM CQ) 21 mg/24hr patch Place 1 patch (21 mg total) onto the skin daily.  28 patch  0  . permethrin (ELIMITE) 5 % cream Use as directed on bottle, may repeat in 1 week.  60 g  1    Review of Systems  Constitutional: Negative for fever, chills and malaise/fatigue.  Gastrointestinal: Positive for abdominal pain. Negative for nausea and vomiting.  Genitourinary: Negative for dysuria.  Neurological: Negative for dizziness and headaches.   Physical Exam   Blood pressure 102/63, pulse 60, temperature 98.3 F (36.8 C), temperature source Oral, resp. rate 16, height 5\' 2"  (1.575 m), weight 125 lb (56.7 kg), last menstrual period 12/13/2013, unknown if currently breastfeeding.  Physical Exam  Constitutional: She is oriented to person, place, and time. She appears well-developed and well-nourished.  HENT:  Head: Normocephalic.  Cardiovascular: Normal rate.   Respiratory: Effort normal.  GI: Soft. She exhibits no distension and no mass. There is tenderness (slightly tender over uterus). There is no rebound and no guarding.  Musculoskeletal: Normal range of motion.  Neurological: She is alert and oriented to person, place, and time.  Skin: Skin is warm and dry.  Psychiatric: She has a normal mood and affect.   [place] prelim:  6.4wks                    FHR 120                     Right CLC  Results for orders placed during the hospital encounter of 12/21/13 (from the past 72 hour(s))  HCG, QUANTITATIVE, PREGNANCY     Status: Abnormal   Collection Time    12/21/13  4:13 PM      Result Value Ref Range   hCG, Beta Chain, Quant, S 99556 (*) <5 mIU/mL   Comment:              GEST. AGE      CONC.  (mIU/mL)       <=1 WEEK        5 -  50         2 WEEKS       50 - 500         3 WEEKS       100 - 10,000         4 WEEKS     1,000 - 30,000         5 WEEKS     3,500 - 115,000       6-8 WEEKS     12,000 - 270,000        12 WEEKS     15,000 - 220,000                FEMALE AND NON-PREGNANT FEMALE:         LESS THAN 5 mIU/mL  CBC     Status: Abnormal   Collection Time    12/21/13  4:13 PM      Result Value Ref Range   WBC 7.7  4.0 - 10.5 K/uL   RBC 4.06  3.87 - 5.11 MIL/uL   Hemoglobin 12.1  12.0 - 15.0 g/dL   HCT 16.1 (*) 09.6 - 04.5 %   MCV 85.5  78.0 - 100.0 fL   MCH 29.8  26.0 - 34.0 pg   MCHC 34.9  30.0 - 36.0 g/dL   RDW 40.9  81.1 - 91.4 %   Platelets 217  150 - 400 K/uL  HIV ANTIBODY (ROUTINE TESTING)     Status: None   Collection Time    12/21/13  4:13 PM      Result Value Ref Range   HIV 1&2 Ab, 4th Generation NONREACTIVE  NONREACTIVE   Comment: (NOTE)     A NONREACTIVE HIV Ag/Ab result does not exclude HIV infection since     the time frame for seroconversion is variable. If acute HIV infection     is suspected, a HIV-1 RNA Qualitative TMA test is recommended.     HIV-1/2 Antibody Diff         Not indicated.     HIV-1 RNA, Qual TMA           Not indicated.     PLEASE NOTE: This information has  been disclosed to you from records     whose confidentiality may be protected by state law. If your state     requires such protection, then the state law prohibits you from making     any further disclosure of the information without the specific written     consent of the person to whom it pertains, or as otherwise permitted     by law. A general authorization for the release of medical or other     information is NOT sufficient for this purpose.     The performance of this assay has not been clinically validated in     patients less than 65 years old.     Performed at [facility]  GC/CHLAMYDIA PROBE AMP     Status: None   Collection Time    12/21/13  4:30 PM      Result Value Ref Range   CT Probe  RNA NEGATIVE  NEGATIVE   GC Probe RNA NEGATIVE  NEGATIVE   Comment: (NOTE)                                                                                               **Normal Reference Range: Negative**          Assay performed using the Gen-Probe APTIMA COMBO2 (R) Assay.     Acceptable specimen types for this assay include APTIMA Swabs (Unisex,     endocervical, urethral, or vaginal), first void urine, and ThinPrep     liquid based cytology samples.     Performed at [facility]  WET PREP, GENITAL     Status: Abnormal   Collection Time    12/21/13  4:30 PM      Result Value Ref Range   Yeast Wet Prep HPF POC NONE SEEN  NONE SEEN   Trich, Wet Prep NONE SEEN  NONE SEEN   Clue Cells Wet Prep HPF POC FEW (*) NONE SEEN   WBC, Wet Prep HPF POC FEW (*) NONE SEEN   Comment: MANY BACTERIA SEEN    US Ob Transvaginal  12/21/2013   CLINICAL DATA:  Bleeding in early pregnancy (first trimester). Initial encounter  EXAM: OBSTETRIC <14 WK [place] AND TRANSVAGINAL OB US  TECHNIQUE: Both transabdominal and transvaginal ultrasound examinations were performed for complete evaluation of the gestation as well as the maternal uterus, adnexal regions, and pelvic cul-de-sac. Transvaginal technique was performed to assess early pregnancy.  COMPARISON:  None of this gestation    FINDINGS: Intrauterine gestational sac: Present, prominent in size relative to the crown-rump length. There is a mild irregular bulge into the gestational sac at the level of the fetus. No subchronic hemorrhage noted.                       Yolk sac:  Present                       Embryo:  Present  Cardiac Activity: Present  Heart Rate:  120 bpm                       CRL:   6.4 mm  mm   6 w 4 d                  US EDC: 08/12/2014                      Maternal uterus/adnexae: Corpus luteum noted on the right. The left ovary is unremarkable.                     No free pelvic fluid.    IMPRESSION: 1. Single  living intrauterine gestation with estimated age 31 weeks 4 days by crown-rump length.                            2. Probable chorionic bump.     Electronically Signed   By: Tiburcio PeaJonathan  Watts M.D.   On: 12/21/2013 17:51   MAU Course  Procedures   Assessment and Plan  A:  SIUP at 1159w4d      Cramping  P:  Discharge home       Tylenol prn       Encouraged to start prenatal care  [facility] HospitalWILLIAMS,Cyani Kallstrom 12/21/2013, 4:21 PM

## 2013-12-21 NOTE — ED Provider Notes (Signed)
CSN: [id number]     Arrival date & time 12/21/13  1140 History   First MD Initiated Contact with Patient 12/21/13 1354     Chief Complaint  Patient presents with  . Abdominal Pain   (Consider location/radiation/quality/duration/timing/severity/associated sxs/prior Treatment) HPI Comments: LNMP: one week ago [id number] Denies dysuria, vaginal discharge, urinary frequency, flank pain, fever.   Patient is a 28 y.o. female presenting with abdominal pain. The history is provided by the patient.  Abdominal Pain Pain location:  Generalized Pain quality: cramping   Pain severity:  Moderate Onset quality:  Gradual Duration:  5 days Timing:  Constant Progression:  Worsening Chronicity:  New Associated symptoms: belching, constipation, flatus, nausea and vaginal bleeding   Associated symptoms: no dysuria, no hematuria, no vaginal discharge and no vomiting     Past Medical History  Diagnosis Date  . Hemorrhoids   . History of bronchitis   . Seizures     Onset 28 yo  . Asthma     Diagnosed as child  . Hypertension     2012   Past Surgical History  Procedure Laterality Date  . Birth control  07/2007    Mirena 100   Family History  Problem Relation Age of Onset  . Hypertension Mother   . Hyperlipidemia Mother   . Hypertension Sister   . Other Sister     anxiety  . Cancer Maternal Grandmother    History  Substance Use Topics  . Smoking status: Current Every Day Smoker -- 0.25 packs/day for 10 years    Types: Cigarettes  . Smokeless tobacco: Never Used  . Alcohol Use: 1.5 oz/week    3 drink(s) per week     Comment: per week   OB History   Grav Para Term Preterm Abortions TAB SAB Ect Mult Living   6 2 2  0 3 3 0 0 0 2     Review of Systems  Constitutional: Negative.   HENT: Negative.   Respiratory: Negative.   Cardiovascular: Negative.   Gastrointestinal: Positive for nausea, abdominal pain, constipation and flatus. Negative for  vomiting, blood in stool and abdominal distention.  Genitourinary: Positive for vaginal bleeding and pelvic pain. Negative for dysuria, frequency, hematuria, flank pain, vaginal discharge, difficulty urinating, genital sores and vaginal pain.  Musculoskeletal: Negative for back pain.  Skin: Negative.   Neurological: Negative for dizziness, weakness and light-headedness.    Allergies  Review of patient's allergies indicates no known allergies.  Home Medications   Prior to Admission medications   Medication Sig Start Date End Date Taking? Authorizing Provider  hydrochlorothiazide (HYDRODIURIL) 25 MG tablet Take 1 tablet (25 mg total) by mouth daily. 10/23/13  Yes Doris Cheadleeepak Advani, MD  levETIRAcetam (KEPPRA) 500 MG tablet Take 1 tablet (500 mg total) by mouth daily. 12/09/13  Yes Butch PennyMegan Millikan, NP  albuterol (PROVENTIL HFA;VENTOLIN HFA) 108 (90 BASE) MCG/ACT inhaler Inhale 2 puffs into the lungs every 4 (four) hours as needed for wheezing. 07/27/12   Adrian BlackwaterZachary H Baker, PA-C  fexofenadine (ALLEGRA) 60 MG tablet Take 1 tablet (60 mg total) by mouth 2 (two) times daily. 06/26/13   Hayden Rasmussenavid Mabe, NP  nicotine (NICODERM CQ) 21 mg/24hr patch Place 1 patch (21 mg total) onto the skin daily. 10/23/13   Doris Cheadleeepak Advani, MD  permethrin (ELIMITE) 5 % cream Use as directed on bottle, may repeat in 1 week. 11/16/13   Linna HoffJames D Kindl, MD   BP 107/68  Pulse 65  Temp(Src)  99 F (37.2 C) (Oral)  Resp 14  SpO2 100%  LMP 05/27/2013 Physical Exam  Nursing note and vitals reviewed. Constitutional: She is oriented to person, place, and time. She appears well-developed and well-nourished. No distress.  HENT:  Head: Normocephalic and atraumatic.  Eyes: Conjunctivae are normal. No scleral icterus.  Cardiovascular: Normal rate, regular rhythm and normal heart sounds.   Pulmonary/Chest: Effort normal and breath sounds normal. No respiratory distress. She has no wheezes.  Abdominal: Soft. Normal appearance and bowel sounds are  normal. She exhibits no distension and no mass. There is no rigidity, no rebound, no guarding and no CVA tenderness.  Reports mild diffuse tenderness, but patient has not focal areas of pain with palpation No clinical signs of peritonitis   Neurological: She is alert and oriented to person, place, and time.  Skin: Skin is warm and dry. No rash noted. No erythema.  Psychiatric: She has a normal mood and affect. Her behavior is normal.    ED Course  Procedures (including critical care time) Labs Review Labs Reviewed  POCT PREGNANCY, URINE - Abnormal; Notable for the following:    Preg Test, Ur POSITIVE (*)    All other components within normal limits  POCT URINALYSIS DIP (DEVICE)    Imaging Review No results found.   MDM  No diagnosis found. UA normal.  UPT positive. Needs investigation for possible ectopic pregnancy vs incomplete spontaneous abortion given recent reported menstrual cycle Patient hemodynamically stable [facility] and discussed case with provider in unit. Patient accepted for transfer by Dr. Gigi GinPeggy constant. [facility] and [facility].     Ria ClockJennifer Lee H Miakoda Mcmillion, GeorgiaPA 12/21/13 1436

## 2013-12-22 ENCOUNTER — Encounter (HOSPITAL_COMMUNITY): Payer: Self-pay | Admitting: Advanced Practice Midwife

## 2013-12-22 LAB — GC/CHLAMYDIA PROBE AMP
CT PROBE, AMP APTIMA: NEGATIVE
GC PROBE AMP APTIMA: NEGATIVE

## 2013-12-23 NOTE — MAU Provider Note (Signed)
Attestation of Attending Supervision of Advanced Practitioner (CNM/NP): Evaluation and management procedures were performed by the Advanced Practitioner under my supervision and collaboration.  I have reviewed the Advanced Practitioner's note and chart, and I agree with the management and plan.  Jasmene Goswami 12/23/2013 1:04 PM

## 2013-12-28 ENCOUNTER — Encounter (HOSPITAL_COMMUNITY): Payer: Self-pay | Admitting: Advanced Practice Midwife

## 2014-01-15 ENCOUNTER — Ambulatory Visit: Payer: Medicaid Other | Admitting: Obstetrics & Gynecology

## 2014-02-01 ENCOUNTER — Ambulatory Visit: Payer: Medicaid Other | Attending: Internal Medicine | Admitting: Internal Medicine

## 2014-02-01 DIAGNOSIS — O039 Complete or unspecified spontaneous abortion without complication: Secondary | ICD-10-CM | POA: Diagnosis not present

## 2014-02-01 DIAGNOSIS — Z3201 Encounter for pregnancy test, result positive: Secondary | ICD-10-CM | POA: Diagnosis present

## 2014-02-01 LAB — POCT URINE PREGNANCY: Preg Test, Ur: POSITIVE

## 2014-02-01 NOTE — Progress Notes (Signed)
Patient walked in asking for urine pregnancy test following elective abortion on 12/28/13 Also c/o 2 week hx rash on breast  Urine pregnancy test: positive  Patient states she has an appt at clinic tomorrow where procedure was performed to f/u on positive pregnancy test  Patient instructed to schedule appt with PCP for rash on breast  Agree with RN's assessment and intervention  Doris Cheadleeepak Advani, MD

## 2014-02-08 ENCOUNTER — Ambulatory Visit: Payer: Medicaid Other | Admitting: Internal Medicine

## 2014-02-08 ENCOUNTER — Other Ambulatory Visit (HOSPITAL_COMMUNITY)
Admission: RE | Admit: 2014-02-08 | Discharge: 2014-02-08 | Disposition: A | Discharge status: Home / Self Care | Payer: Medicaid Other | Source: Ambulatory Visit | Attending: Internal Medicine | Admitting: Internal Medicine

## 2014-02-08 ENCOUNTER — Encounter: Payer: Self-pay | Admitting: Internal Medicine

## 2014-02-08 ENCOUNTER — Ambulatory Visit: Payer: Medicaid Other | Attending: Internal Medicine | Admitting: Internal Medicine

## 2014-02-08 VITALS — BP 121/85 | HR 71 | Temp 99.2°F | Resp 16 | Ht 61.0 in | Wt 123.0 lb

## 2014-02-08 DIAGNOSIS — Z720 Tobacco use: Secondary | ICD-10-CM

## 2014-02-08 DIAGNOSIS — N760 Acute vaginitis: Secondary | ICD-10-CM | POA: Insufficient documentation

## 2014-02-08 DIAGNOSIS — Z79899 Other long term (current) drug therapy: Secondary | ICD-10-CM | POA: Insufficient documentation

## 2014-02-08 DIAGNOSIS — Z0000 Encounter for general adult medical examination without abnormal findings: Secondary | ICD-10-CM | POA: Diagnosis present

## 2014-02-08 DIAGNOSIS — I10 Essential (primary) hypertension: Secondary | ICD-10-CM | POA: Insufficient documentation

## 2014-02-08 DIAGNOSIS — F17210 Nicotine dependence, cigarettes, uncomplicated: Secondary | ICD-10-CM | POA: Insufficient documentation

## 2014-02-08 DIAGNOSIS — F17200 Nicotine dependence, unspecified, uncomplicated: Secondary | ICD-10-CM

## 2014-02-08 DIAGNOSIS — Z01419 Encounter for gynecological examination (general) (routine) without abnormal findings: Secondary | ICD-10-CM | POA: Insufficient documentation

## 2014-02-08 DIAGNOSIS — L309 Dermatitis, unspecified: Secondary | ICD-10-CM | POA: Diagnosis not present

## 2014-02-08 DIAGNOSIS — Z113 Encounter for screening for infections with a predominantly sexual mode of transmission: Secondary | ICD-10-CM | POA: Diagnosis present

## 2014-02-08 LAB — LIPID PANEL
CHOL/HDL RATIO: 3.6 ratio
Cholesterol: 193 mg/dL (ref 0–200)
HDL: 53 mg/dL (ref >39)
LDL Cholesterol: 123 mg/dL — ABNORMAL HIGH (ref 0–99)
Triglycerides: 84 mg/dL (ref <150)
VLDL: 17 mg/dL (ref 0–40)

## 2014-02-08 MED ORDER — TRIAMCINOLONE ACETONIDE 0.1 % EX CREA: 1.0000 "application " | TOPICAL_CREAM | Freq: Two times a day (BID) | CUTANEOUS | Status: DC

## 2014-02-08 NOTE — Progress Notes (Signed)
Patient ID: Rhonda Kennedy, female   DOB: March 27, 1985, 28 y.o.   MRN: [id number]  CC: physical  HPI:  Patient presents to clinic today for a physical and pap smear.  Patient recently had a elective abortion last month due to an unwanted pregnancy.  She states that last week she had a repeat abortion due to continued positive pregnancy test.  She is due to follow up next week. Patient reports that she had a pap smear last year and it was normal.  Patient would like a repeat pap smear today due to the recent abortion.   Rash under breast, vagina, and lower back for the past 2 weeks.  Very itchy and erythematous.  It began on her breast and now in her vaginal region with dry skin.  Denies vagina discharge, odor, or lesion. Denies breast mass or nipple discharge.  She does have breast tenderness.   Smokes 5 cigarettes per day and has occasional alcohol use.  Denies illicit drug use.     No Known Allergies Past Medical History  Diagnosis Date  . Hemorrhoids   . History of bronchitis   . Seizures     Onset 28 yo  . Asthma     Diagnosed as child  . Hypertension     2012   Current Outpatient Prescriptions on File Prior to Visit  Medication Sig Dispense Refill  . hydrochlorothiazide (HYDRODIURIL) 25 MG tablet Take 1 tablet (25 mg total) by mouth daily. 90 tablet 3  . levETIRAcetam (KEPPRA) 500 MG tablet Take 1 tablet (500 mg total) by mouth daily. 30 tablet 11  . albuterol (PROVENTIL HFA;VENTOLIN HFA) 108 (90 BASE) MCG/ACT inhaler Inhale 2 puffs into the lungs every 4 (four) hours as needed for wheezing. (Patient not taking: Reported on 02/08/2014) 1 Inhaler 0  . fexofenadine (ALLEGRA) 60 MG tablet Take 1 tablet (60 mg total) by mouth 2 (two) times daily. (Patient not taking: Reported on 02/08/2014) 30 tablet 1  . nicotine (NICODERM CQ) 21 mg/24hr patch Place 1 patch (21 mg total) onto the skin daily. (Patient not taking: Reported on 02/08/2014) 28 patch 0  . permethrin (ELIMITE) 5 % cream Use as  directed on bottle, may repeat in 1 week. (Patient not taking: Reported on 02/08/2014) 60 g 1   Current Facility-Administered Medications on File Prior to Visit  Medication Dose Route Frequency Provider Last Rate Last Dose  . azithromycin (ZITHROMAX) tablet 1,000 mg  1,000 mg Oral Once Aviva SignsMarie L Williams, CNM       Family History  Problem Relation Age of Onset  . Hypertension Mother   . Hyperlipidemia Mother   . Hypertension Sister   . Other Sister     anxiety  . Cancer Maternal Grandmother    History   Social History  . Marital Status: Single    Spouse Name: N/A    Number of Children: N/A  . Years of Education: N/A   Occupational History  . Not on file.   Social History Main Topics  . Smoking status: Current Every Day Smoker -- 0.25 packs/day for 10 years    Types: Cigarettes  . Smokeless tobacco: Never Used  . Alcohol Use: 1.5 oz/week    3 drink(s) per week     Comment: per week  . Drug Use: No     Comment: marijuana  . Sexual Activity: Yes    Birth Control/ Protection: Condom   Other Topics Concern  . Not on file   Social History Narrative  Review of Systems  Skin: Positive for itching and rash.  All other systems reviewed and are negative.  .    Objective:   Filed Vitals:   02/08/14 1027  BP: 121/85  Pulse: 71  Temp: 99.2 F (37.3 C)  Resp: 16    Physical Exam  Cardiovascular: Normal rate, regular rhythm and normal heart sounds.   Pulmonary/Chest: Effort normal. Right breast exhibits no inverted nipple, no mass and no skin change. Left breast exhibits no inverted nipple, no mass and no skin change.  Abdominal: Soft. Bowel sounds are normal. She exhibits no distension and no mass. There is tenderness.  Genitourinary: There is breast tenderness. No breast discharge. Cervix exhibits friability. Cervix exhibits no motion tenderness and no discharge. Right adnexum displays no tenderness. Left adnexum displays no tenderness.  Lymphadenopathy:        Right: No inguinal adenopathy present.       Left: No inguinal adenopathy present.  Skin: Skin is warm and dry. Rash noted.     Dermatitis-patches of dry skin      Lab Results  Component Value Date   WBC 7.7 12/21/2013   HGB 12.1 12/21/2013   HCT 34.7* 12/21/2013   MCV 85.5 12/21/2013   PLT 217 12/21/2013   Lab Results  Component Value Date   CREATININE 0.76 10/23/2013   BUN 5* 10/23/2013   NA 139 10/23/2013   K 4.1 10/23/2013   CL 104 10/23/2013   CO2 27 10/23/2013    No results found for: HGBA1C Lipid Panel  No results found for: CHOL, TRIG, HDL, CHOLHDL, VLDL, LDLCALC     Assessment and plan:   Rhonda Kennedy was seen today for follow-up.  Diagnoses and associated orders for this visit:  Annual physical exam - Cytology - PAP [place] - Cervicovaginal ancillary only - HIV antibody (with reflex) - RPR - Lipid panel Explained routine health maintanence for women including annual influenza vaccine, pneumonia vaccine, breast cancer screening after age 28, montly breast exams, STD prevention and screening, colonoscopy at age 28, smoking cessation, avoiding alcohol misuse  Dermatitis - triamcinolone cream (KENALOG) 0.1 %; Apply 1 application topically 2 (two) times daily.  Smoking Smoking cessation discussed for 3 minutes, patient is not willing to quit at this time. Will continue to assess on each visit. Discussed increased risk for diseases such as cancer, heart disease, and stroke.   Return if symptoms worsen or fail to improve.  May RTC in 2 weeks if no improvement in Rash.        Rhonda Kennedy, Rhonda Boulanger, NP-C [facility] [phone] 02/08/2014, 10:33 AM

## 2014-02-08 NOTE — Progress Notes (Signed)
Pt is here for a physical and a pap smear. Pt states that she has a rash on her breast, vaginal area and her lower back.

## 2014-02-09 LAB — RPR

## 2014-02-09 LAB — CERVICOVAGINAL ANCILLARY ONLY
Chlamydia: NEGATIVE
Neisseria Gonorrhea: NEGATIVE
WET PREP (BD AFFIRM): NEGATIVE
Wet Prep (BD Affirm): NEGATIVE
Wet Prep (BD Affirm): POSITIVE — AB

## 2014-02-09 LAB — CYTOLOGY - PAP

## 2014-02-09 LAB — HIV ANTIBODY (ROUTINE TESTING W REFLEX): HIV 1&2 Ab, 4th Generation: NONREACTIVE

## 2014-02-10 ENCOUNTER — Telehealth: Payer: Self-pay | Admitting: Emergency Medicine

## 2014-02-10 MED ORDER — METRONIDAZOLE 500 MG PO TABS: 500.0000 mg | ORAL_TABLET | Freq: Two times a day (BID) | ORAL | Status: DC

## 2014-02-10 NOTE — Telephone Encounter (Signed)
Pt given lab results with instructions to start new medication Flagyl 500 mg tab BID x 7dys Instructions given to referain from alcohol while taking medication Informed we are still waiting for GC/HIV/Syphilis results

## 2014-02-10 NOTE — Telephone Encounter (Signed)
-----   Message from Ambrose FinlandValerie A Keck, NP sent at 02/09/2014  1:10 PM EST ----- Patient positive for Bacterial Vaginosis . Please explain this is not a STD, but a imbalance of the vaginal pH. Please send Flagyl 500 mg BID for 7 days. No refills, no alcohol while on this medication. Still waiting for GC and pap results HIV/Syphilis negative

## 2014-04-20 NOTE — Telephone Encounter (Signed)
BP recheck

## 2014-07-26 ENCOUNTER — Encounter (HOSPITAL_COMMUNITY): Payer: Self-pay | Admitting: Emergency Medicine

## 2014-07-26 ENCOUNTER — Emergency Department (HOSPITAL_COMMUNITY)
Admission: EM | Admit: 2014-07-26 | Discharge: 2014-07-27 | Disposition: A | Discharge status: Home / Self Care | Payer: No Typology Code available for payment source | Attending: Emergency Medicine | Admitting: Emergency Medicine

## 2014-07-26 ENCOUNTER — Emergency Department (HOSPITAL_COMMUNITY): Payer: No Typology Code available for payment source

## 2014-07-26 DIAGNOSIS — S29012A Strain of muscle and tendon of back wall of thorax, initial encounter: Secondary | ICD-10-CM

## 2014-07-26 DIAGNOSIS — G40909 Epilepsy, unspecified, not intractable, without status epilepticus: Secondary | ICD-10-CM | POA: Diagnosis not present

## 2014-07-26 DIAGNOSIS — I10 Essential (primary) hypertension: Secondary | ICD-10-CM | POA: Diagnosis not present

## 2014-07-26 DIAGNOSIS — Y9389 Activity, other specified: Secondary | ICD-10-CM | POA: Insufficient documentation

## 2014-07-26 DIAGNOSIS — Y92410 Unspecified street and highway as the place of occurrence of the external cause: Secondary | ICD-10-CM | POA: Diagnosis not present

## 2014-07-26 DIAGNOSIS — Z79899 Other long term (current) drug therapy: Secondary | ICD-10-CM | POA: Insufficient documentation

## 2014-07-26 DIAGNOSIS — Y998 Other external cause status: Secondary | ICD-10-CM | POA: Diagnosis not present

## 2014-07-26 DIAGNOSIS — R52 Pain, unspecified: Secondary | ICD-10-CM

## 2014-07-26 DIAGNOSIS — S161XXA Strain of muscle, fascia and tendon at neck level, initial encounter: Secondary | ICD-10-CM | POA: Diagnosis not present

## 2014-07-26 DIAGNOSIS — S239XXA Sprain of unspecified parts of thorax, initial encounter: Secondary | ICD-10-CM | POA: Diagnosis not present

## 2014-07-26 DIAGNOSIS — S233XXA Sprain of ligaments of thoracic spine, initial encounter: Secondary | ICD-10-CM

## 2014-07-26 DIAGNOSIS — Z720 Tobacco use: Secondary | ICD-10-CM | POA: Diagnosis not present

## 2014-07-26 DIAGNOSIS — J45909 Unspecified asthma, uncomplicated: Secondary | ICD-10-CM | POA: Insufficient documentation

## 2014-07-26 DIAGNOSIS — S199XXA Unspecified injury of neck, initial encounter: Secondary | ICD-10-CM | POA: Diagnosis present

## 2014-07-26 MED ORDER — NAPROXEN 250 MG PO TABS
500.0000 mg | ORAL_TABLET | Freq: Once | ORAL | Status: AC
  Administered 2014-07-27: 500 mg via ORAL
  Filled 2014-07-26: qty 2
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
## 2014-07-26 NOTE — ED Notes (Signed)
Pt at xray
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
## 2014-07-27 ENCOUNTER — Emergency Department (HOSPITAL_COMMUNITY): Payer: No Typology Code available for payment source

## 2014-07-27 MED ORDER — NAPROXEN 500 MG PO TABS: 500.0000 mg | ORAL_TABLET | Freq: Two times a day (BID) | ORAL | Status: DC

## 2014-07-27 MED ORDER — CYCLOBENZAPRINE HCL 10 MG PO TABS: 10.0000 mg | ORAL_TABLET | Freq: Two times a day (BID) | ORAL | Status: DC | PRN

## 2014-07-27 NOTE — Discharge Instructions (Signed)
1. Medications: flexeril, naproxyn, usual home medications 2. Treatment: rest, drink plenty of fluids, gentle stretching as discussed, alternate ice and heat 3. Follow Up: Please followup with your primary doctor in 3 days for discussion of your diagnoses and further evaluation after today's visit; if you do not have a primary care doctor use the resource guide provided to find one;  Return to the ER for worsening back pain, difficulty walking, loss of bowel or bladder control or other concerning symptoms    Back Exercises Back exercises help treat and prevent back injuries. The goal of back exercises is to increase the strength of your abdominal and back muscles and the flexibility of your back. These exercises should be started when you no longer have back pain. Back exercises include:  Pelvic Tilt. Lie on your back with your knees bent. Tilt your pelvis until the lower part of your back is against the floor. Hold this position 5 to 10 sec and repeat 5 to 10 times.  Knee to Chest. Pull first 1 knee up against your chest and hold for 20 to 30 seconds, repeat this with the other knee, and then both knees. This may be done with the other leg straight or bent, whichever feels better.  Sit-Ups or Curl-Ups. Bend your knees 90 degrees. Start with tilting your pelvis, and do a partial, slow sit-up, lifting your trunk only 30 to 45 degrees off the floor. Take at least 2 to 3 seconds for each sit-up. Do not do sit-ups with your knees out straight. If partial sit-ups are difficult, simply do the above but with only tightening your abdominal muscles and holding it as directed.  Hip-Lift. Lie on your back with your knees flexed 90 degrees. Push down with your feet and shoulders as you raise your hips a couple inches off the floor; hold for 10 seconds, repeat 5 to 10 times.  Back arches. Lie on your stomach, propping yourself up on bent elbows. Slowly press on your hands, causing an arch in your low back.  Repeat 3 to 5 times. Any initial stiffness and discomfort should lessen with repetition over time.  Shoulder-Lifts. Lie face down with arms beside your body. Keep hips and torso pressed to floor as you slowly lift your head and shoulders off the floor. Do not overdo your exercises, especially in the beginning. Exercises may cause you some mild back discomfort which lasts for a few minutes; however, if the pain is more severe, or lasts for more than 15 minutes, do not continue exercises until you see your caregiver. Improvement with exercise therapy for back problems is slow.  See your caregivers for assistance with developing a proper back exercise program. Document Released: 03/22/2004 Document Revised: 05/07/2011 Document Reviewed: 12/14/2010 [facility] Patient Information 2015 [employer], [employer]. This information is not intended to replace advice given to you by your health care provider. Make sure you discuss any questions you have with your health care provider.

## 2014-07-27 NOTE — ED Notes (Signed)
Pt stable, ambulatory, states understanding of discharge instructions 

## 2014-08-06 ENCOUNTER — Ambulatory Visit: Payer: Medicaid Other | Admitting: Obstetrics & Gynecology

## 2014-09-03 ENCOUNTER — Encounter: Payer: Self-pay | Admitting: Family Medicine

## 2014-09-03 ENCOUNTER — Ambulatory Visit (INDEPENDENT_AMBULATORY_CARE_PROVIDER_SITE_OTHER): Payer: Medicaid Other | Admitting: Family Medicine

## 2014-09-03 VITALS — BP 137/101 | HR 66 | Temp 98.3°F | Wt 143.8 lb

## 2014-09-03 DIAGNOSIS — IMO0001 Reserved for inherently not codable concepts without codable children: Secondary | ICD-10-CM

## 2014-09-03 DIAGNOSIS — F17200 Nicotine dependence, unspecified, uncomplicated: Secondary | ICD-10-CM

## 2014-09-03 DIAGNOSIS — Z01419 Encounter for gynecological examination (general) (routine) without abnormal findings: Secondary | ICD-10-CM

## 2014-09-03 DIAGNOSIS — Z309 Encounter for contraceptive management, unspecified: Secondary | ICD-10-CM | POA: Diagnosis not present

## 2014-09-03 DIAGNOSIS — Z720 Tobacco use: Secondary | ICD-10-CM

## 2014-09-03 DIAGNOSIS — Z0000 Encounter for general adult medical examination without abnormal findings: Secondary | ICD-10-CM

## 2014-09-03 DIAGNOSIS — Z3009 Encounter for other general counseling and advice on contraception: Secondary | ICD-10-CM

## 2014-09-03 LAB — POCT URINALYSIS DIP (DEVICE)
Bilirubin Urine: NEGATIVE
Glucose, UA: NEGATIVE mg/dL
Hgb urine dipstick: NEGATIVE
Ketones, ur: NEGATIVE mg/dL
Leukocytes, UA: NEGATIVE
NITRITE: NEGATIVE
PROTEIN: NEGATIVE mg/dL
Specific Gravity, Urine: 1.02 (ref 1.005–1.030)
UROBILINOGEN UA: 0.2 mg/dL (ref 0.0–1.0)
pH: 5.5 (ref 5.0–8.0)

## 2014-09-03 MED ORDER — NORGESTIMATE-ETH ESTRADIOL 0.25-35 MG-MCG PO TABS: 1.0000 | ORAL_TABLET | Freq: Every day | ORAL | Status: DC

## 2014-09-03 MED ORDER — NICOTINE 7 MG/24HR TD PT24: 7.0000 mg | MEDICATED_PATCH | Freq: Every day | TRANSDERMAL | Status: DC

## 2014-09-03 NOTE — Patient Instructions (Addendum)
You have elected for oral pills for birth control.  Follow up in 1 year or sooner if needed.  We will contact you with the results of your labs.  We encourage you to stop smoking.  A prescription for nicotine patches have been sent into your pharmacy.   Smoking Cessation Quitting smoking is important to your health and has many advantages. However, it is not always easy to quit since nicotine is a very addictive drug. Oftentimes, people try 3 times or more before being able to quit. This document explains the best ways for you to prepare to quit smoking. Quitting takes hard work and a lot of effort, but you can do it. ADVANTAGES OF QUITTING SMOKING  You will live longer, feel better, and live better.  Your body will feel the impact of quitting smoking almost immediately.  Within 20 minutes, blood pressure decreases. Your pulse returns to its normal level.  After 8 hours, carbon monoxide levels in the blood return to normal. Your oxygen level increases.  After 24 hours, the chance of having a heart attack starts to decrease. Your breath, hair, and body stop smelling like smoke.  After 48 hours, damaged nerve endings begin to recover. Your sense of taste and smell improve.  After 72 hours, the body is virtually free of nicotine. Your bronchial tubes relax and breathing becomes easier.  After 2 to 12 weeks, lungs can hold more air. Exercise becomes easier and circulation improves.  The risk of having a heart attack, stroke, cancer, or lung disease is greatly reduced.  After 1 year, the risk of coronary heart disease is cut in half.  After 5 years, the risk of stroke falls to the same as a nonsmoker.  After 10 years, the risk of lung cancer is cut in half and the risk of other cancers decreases significantly.  After 15 years, the risk of coronary heart disease drops, usually to the level of a nonsmoker.  If you are pregnant, quitting smoking will improve your chances of having a healthy  baby.  The people you live with, especially any children, will be healthier.  You will have extra money to spend on things other than cigarettes. QUESTIONS TO THINK ABOUT BEFORE ATTEMPTING TO QUIT You may want to talk about your answers with your health care provider.  Why do you want to quit?  If you tried to quit in the past, what helped and what did not?  What will be the most difficult situations for you after you quit? How will you plan to handle them?  Who can help you through the tough times? Your family? Friends? A health care provider?  What pleasures do you get from smoking? What ways can you still get pleasure if you quit? Here are some questions to ask your health care provider:  How can you help me to be successful at quitting?  What medicine do you think would be best for me and how should I take it?  What should I do if I need more help?  What is smoking withdrawal like? How can I get information on withdrawal? GET READY  Set a quit date.  Change your environment by getting rid of all cigarettes, ashtrays, matches, and lighters in your home, car, or work. Do not let people smoke in your home.  Review your past attempts to quit. Think about what worked and what did not. GET SUPPORT AND ENCOURAGEMENT You have a better chance of being successful if you have  help. You can get support in many ways.  Tell your family, friends, and coworkers that you are going to quit and need their support. Ask them not to smoke around you.  Get individual, group, or telephone counseling and support. Programs are available at [facility]. Call your local health department for information about programs in your area.  Spiritual beliefs and practices may help some smokers quit.  Download a "quit meter" on your computer to keep track of quit statistics, such as how long you have gone without smoking, cigarettes not smoked, and money saved.  Get a self-help book  about quitting smoking and staying off tobacco. LEARN NEW SKILLS AND BEHAVIORS  Distract yourself from urges to smoke. Talk to someone, go for a walk, or occupy your time with a task.  Change your normal routine. Take a different route to work. Drink tea instead of coffee. Eat breakfast in a different place.  Reduce your stress. Take a hot bath, exercise, or read a book.  Plan something enjoyable to do every day. Reward yourself for not smoking.  Explore interactive web-based programs that specialize in helping you quit. GET MEDICINE AND USE IT CORRECTLY Medicines can help you stop smoking and decrease the urge to smoke. Combining medicine with the above behavioral methods and support can greatly increase your chances of successfully quitting smoking.  Nicotine replacement therapy helps deliver nicotine to your body without the negative effects and risks of smoking. Nicotine replacement therapy includes nicotine gum, lozenges, inhalers, nasal sprays, and skin patches. Some may be available over-the-counter and others require a prescription.  Antidepressant medicine helps people abstain from smoking, but how this works is unknown. This medicine is available by prescription.  Nicotinic receptor partial agonist medicine simulates the effect of nicotine in your brain. This medicine is available by prescription. Ask your health care provider for advice about which medicines to use and how to use them based on your health history. Your health care provider will tell you what side effects to look out for if you choose to be on a medicine or therapy. Carefully read the information on the package. Do not use any other product containing nicotine while using a nicotine replacement product.  RELAPSE OR DIFFICULT SITUATIONS Most relapses occur within the first 3 months after quitting. Do not be discouraged if you start smoking again. Remember, most people try several times before finally quitting. You may  have symptoms of withdrawal because your body is used to nicotine. You may crave cigarettes, be irritable, feel very hungry, cough often, get headaches, or have difficulty concentrating. The withdrawal symptoms are only temporary. They are strongest when you first quit, but they will go away within 10-14 days. To reduce the chances of relapse, try to:  Avoid drinking alcohol. Drinking lowers your chances of successfully quitting.  Reduce the amount of caffeine you consume. Once you quit smoking, the amount of caffeine in your body increases and can give you symptoms, such as a rapid heartbeat, sweating, and anxiety.  Avoid smokers because they can make you want to smoke.  Do not let weight gain distract you. Many smokers will gain weight when they quit, usually less than 10 pounds. Eat a healthy diet and stay active. You can always lose the weight gained after you quit.  Find ways to improve your mood other than smoking. FOR MORE INFORMATION  www.smokefree.gov  Document Released: 02/06/2001 Document Revised: 06/29/2013 Document Reviewed: 05/24/2011 [facility] Patient Information 2015 [place]Care, [place].  This information is not intended to replace advice given to you by your health care provider. Make sure you discuss any questions you have with your health care provider.

## 2014-09-03 NOTE — Progress Notes (Signed)
    Subjective: CC:  discuss birth control, vaginal irritation HPI: Patient is a 29 y.o. female presenting to clinic today to discuss birth control options. Concerns today include:  1. Birth control LMP 08/30/2014, Periods are regular and last 5 days.  Patient is a current smoker.  No personal or family h/o clotting disorders/PE.  Patient has used IUD, Lorayne OCPs, Depo, Nuvaring, OrthoEvra in the past.  Wanting to resume OCPs.  Patient is currently sexually active with 2 partners (female).  Has a h/o STIs (GC/CT).  Patient's current plans for pregnancy: are not wanting to become pregnancy for several years.  2. Vaginal irritation Has been going on since 2 weeks ago.  Has a tingling sensation in the vagina.  No vaginal discharge, itching, dysuria, foul odors, fevers, abdominal pain. Uses condoms currently but not all the time.  Social History Reviewed: current smoker. Smokes about 5 cig/day. Tried self discontinuation without success.  Did not continue to use patches.   FamHx and MedHx updated.  Please see EMR. Health Maintenance: last pap 01/2014- normal  ROS: All other systems reviewed and are negative.  Objective: Office vital signs reviewed. BP 137/101 mmHg  Pulse 66  Temp(Src) 98.3 F (36.8 C)  Wt 143 lb 12.8 oz (65.227 kg)  LMP 08/30/2014  Breastfeeding? No  Physical Examination:  General: Awake, alert, well nourished, NAD HEENT: Normal, EOMI Cardio: heart rate regular, +2DP Pulm: no increased WOB, no wheeze GI: soft, NT/ND,+BS x4, no hepatomegaly, no splenomegaly GU: external vagina normal, cervix normal appearing, no lesions appreciated, scant bleeding from cervical os, no cervical motion TTP, no adnexal masses Extremities: WWP, No edema, cyanosis or clubbing; +2 pulses bilaterally MSK: Normal gait and station Skin: dry, intact, no rashes or lesions Neuro: Strength and sensation grossly intact, follows commands  Results for orders placed or performed in visit on  09/03/14 (from the past 24 hour(s))  POCT urinalysis dip (device)     Status: None   Collection Time: 09/03/14 11:12 AM  Result Value Ref Range   Glucose, UA NEGATIVE NEGATIVE mg/dL   Bilirubin Urine NEGATIVE NEGATIVE   Ketones, ur NEGATIVE NEGATIVE mg/dL   Specific Gravity, Urine 1.020 1.005 - 1.030   Hgb urine dipstick NEGATIVE NEGATIVE   pH 5.5 5.0 - 8.0   Protein, ur NEGATIVE NEGATIVE mg/dL   Urobilinogen, UA 0.2 0.0 - 1.0 mg/dL   Nitrite NEGATIVE NEGATIVE   Leukocytes, UA NEGATIVE NEGATIVE   Assessment: 29 y.o. female with healthy exam.  She desires OCPs for contraception.  Contemplative vs Action phase of smoking cessation. UA negative  Plan: -Contraception counseling provided.  Safe sex with consistent condom use encouraged.  Sprintec rx'd -Smoking cessation counseling provided.  Nicotine patch 7mg /d Rxd -GC/CT, wet prep obtained today.  Contact patient with results -Return in 1 year for annual or sooner if needed.  Raliegh IpAshly M Maximilien Hayashi, DO PGY-2, [facility]

## 2014-09-03 NOTE — Progress Notes (Signed)
Patient ID: Rhonda Kennedy, female   DOB: 1985/10/12, 29 y.o.   MRN: [id number] Pt describes tingling sensation for the past 2 wks, unsure if she has urinary infection or std.  Desires std testing.  Denies urinary frequency or burning, states she has increased volume of urine. Pt desires OCP for birth control

## 2014-09-04 LAB — GC/CHLAMYDIA PROBE AMP
CT PROBE, AMP APTIMA: NEGATIVE
GC Probe RNA: NEGATIVE

## 2014-09-04 LAB — WET PREP, GENITAL
Trich, Wet Prep: NONE SEEN
WBC WET PREP: NONE SEEN
Yeast Wet Prep HPF POC: NONE SEEN

## 2014-09-04 LAB — HIV ANTIBODY (ROUTINE TESTING W REFLEX): HIV 1&2 Ab, 4th Generation: NONREACTIVE

## 2014-09-08 ENCOUNTER — Other Ambulatory Visit: Payer: Self-pay | Admitting: Family Medicine

## 2014-09-08 ENCOUNTER — Telehealth: Payer: Self-pay | Admitting: General Practice

## 2014-09-08 MED ORDER — METRONIDAZOLE 500 MG PO TABS: 500.0000 mg | ORAL_TABLET | Freq: Two times a day (BID) | ORAL | Status: AC

## 2014-09-08 NOTE — Telephone Encounter (Signed)
Called patient and informed her of bv and medication available at pharmacy. Discussed she should avoid alcohol while taking the medication. Patient verbalized understanding and asked if there was anything she could do to prevent it. Discussed some BV prevention methods with patient such as daily probiotics, no scented products or creams, no douching, etc. Patient verbalized understanding to all and had no other questions

## 2014-09-30 ENCOUNTER — Ambulatory Visit: Payer: Medicaid Other | Admitting: Internal Medicine

## 2014-12-06 ENCOUNTER — Other Ambulatory Visit: Payer: Self-pay

## 2014-12-06 DIAGNOSIS — I10 Essential (primary) hypertension: Secondary | ICD-10-CM

## 2014-12-06 MED ORDER — HYDROCHLOROTHIAZIDE 25 MG PO TABS
25.0000 mg | ORAL_TABLET | Freq: Every day | ORAL | Status: DC
Start: 2014-12-06 — End: 2015-01-12

## 2014-12-13 ENCOUNTER — Encounter: Payer: Medicaid Other | Admitting: Family Medicine

## 2014-12-13 ENCOUNTER — Ambulatory Visit: Payer: Medicaid Other | Admitting: Nurse Practitioner

## 2014-12-29 ENCOUNTER — Other Ambulatory Visit: Payer: Self-pay | Admitting: Adult Health

## 2015-01-05 ENCOUNTER — Encounter: Payer: Self-pay | Admitting: Nurse Practitioner

## 2015-01-05 ENCOUNTER — Ambulatory Visit (INDEPENDENT_AMBULATORY_CARE_PROVIDER_SITE_OTHER): Payer: Medicaid Other | Admitting: Nurse Practitioner

## 2015-01-05 VITALS — BP 127/86 | HR 84 | Ht 64.0 in | Wt 141.6 lb

## 2015-01-05 DIAGNOSIS — G40309 Generalized idiopathic epilepsy and epileptic syndromes, not intractable, without status epilepticus: Secondary | ICD-10-CM

## 2015-01-05 DIAGNOSIS — G40909 Epilepsy, unspecified, not intractable, without status epilepticus: Secondary | ICD-10-CM

## 2015-01-05 DIAGNOSIS — Z5181 Encounter for therapeutic drug level monitoring: Secondary | ICD-10-CM

## 2015-01-05 MED ORDER — LEVETIRACETAM 500 MG PO TABS: 500.0000 mg | ORAL_TABLET | Freq: Every day | ORAL | Status: DC

## 2015-01-05 NOTE — Progress Notes (Signed)
[facility]  PATIENT: Rhonda Kennedy DOB: Dec 12, 1985   REASON FOR VISIT: Follow-up for epilepsy  HISTORY FROM: Patient    HISTORY OF PRESENT ILLNESS:Ms. Rhonda Kennedy is a 29 year old female with a history of seizures. She returns today for follow-up. She was last seen by Butch Penny, NP 12/09/13. She reports that she has not had any seizures since her last visit. She is currently taking Keppra and tolerating it well. She is able to operate a motor vehicle without difficulty. She is currently taking a leave from  School. She is working full time. She states that she is sleeping well at night. She also has a history of  HTN and is being treated with Hydrochlorothiazide. She is trying to quit smoking No new neurological complaints.   HISTORY (CM); 29 year old right-handed black female with a history of seizures. She was last seen 12/12/11. Last seizure activity occurred in January 2013 after missing doses of Keppra. This occurred in her sleep. The patient had a MRI of the brain that was unremarkable, and has had an EEG study that was also normal. The patient does report some drowsiness, and she admits to not getting enough sleep The patient has not had any further seizures. He is currently not working but is in school for a business degree. No new neurologic complaints     REVIEW OF SYSTEMS: Full 14 system review of systems performed and notable only for those listed, all others are neg:  Constitutional: neg  Cardiovascular: neg Ear/Nose/Throat: neg  Skin: neg Eyes: neg Respiratory: neg Gastroitestinal: Constipation, diarrhea Hematology/Lymphatic: neg  Endocrine: neg Musculoskeletal:neg Allergy/Immunology: neg Neurological: Occasional headache Psychiatric: Anxiety Sleep : neg   ALLERGIES: No Known Allergies  HOME MEDICATIONS: Outpatient Prescriptions Prior to Visit  Medication Sig Dispense Refill  . albuterol (PROVENTIL HFA;VENTOLIN HFA) 108 (90 BASE)  MCG/ACT inhaler Inhale 2 puffs into the lungs every 4 (four) hours as needed for wheezing. 1 Inhaler 0  . fexofenadine (ALLEGRA) 60 MG tablet Take 1 tablet (60 mg total) by mouth 2 (two) times daily. 30 tablet 1  . hydrochlorothiazide (HYDRODIURIL) 25 MG tablet Take 1 tablet (25 mg total) by mouth daily. 90 tablet 0  . levETIRAcetam (KEPPRA) 500 MG tablet take 1 tablet by mouth once daily 30 tablet 0  . nicotine (NICODERM CQ - DOSED IN MG/24 HR) 7 mg/24hr patch Place 1 patch (7 mg total) onto the skin daily. 28 patch 0  . norgestimate-ethinyl estradiol (ORTHO-CYCLEN,SPRINTEC,PREVIFEM) 0.25-35 MG-MCG tablet Take 1 tablet by mouth daily. 1 Package 11   No facility-administered medications prior to visit.    PAST MEDICAL HISTORY: Past Medical History  Diagnosis Date  . Hemorrhoids   . History of bronchitis   . Seizures (HCC)     Onset 29 yo  . Asthma     Diagnosed as child  . Hypertension     2012    PAST SURGICAL HISTORY: Past Surgical History  Procedure Laterality Date  . Birth control  07/2007    Mirena 100    FAMILY HISTORY: Family History  Problem Relation Age of Onset  . Hypertension Mother   . Hyperlipidemia Mother   . Hypertension Sister   . Other Sister     anxiety  . Cancer Maternal Grandmother     SOCIAL HISTORY: Social History   Social History  . Marital Status: Single    Spouse Name: N/A  . Number of Children: 2  . Years of Education: N/A   Occupational  History  . Not on file.   Social History Main Topics  . Smoking status: Current Every Day Smoker -- 0.25 packs/day for 10 years    Types: Cigarettes  . Smokeless tobacco: Never Used  . Alcohol Use: 1.5 oz/week    3 Standard drinks or equivalent per week     Comment: per week  . Drug Use: No     Comment: marijuana  . Sexual Activity: Not on file   Other Topics Concern  . Not on file   Social History Narrative     PHYSICAL EXAM  Filed Vitals:   01/05/15 0932  BP: 127/86  Pulse: 84    Height: 5\' 4"  (1.626 m)  Weight: 141 lb 9.6 oz (64.229 kg)   Body mass index is 24.29 kg/(m^2). Generalized: Well developed, in no acute distress   Neurological examination  Mentation: Alert oriented to time, place, history taking. Follows all commands speech and language fluent Cranial nerve II-XII: Pupils were equal round reactive to light. Extraocular movements were full, visual field were full on confrontational test. Facial sensation and strength were normal. Uvula tongue midline. Head turning and shoulder shrug were normal and symmetric. Motor: The motor testing reveals 5 over 5 strength of all 4 extremities. Good symmetric motor tone is noted throughout.  Sensory: Sensory testing is intact to soft touch on all 4 extremities. No evidence of extinction is noted.  Coordination: Cerebellar testing reveals good finger-nose-finger and heel-to-shin bilaterally.  Gait and station: Gait is normal. Tandem gait is normal. Romberg is negative. No drift is seen.  Reflexes: Deep tendon reflexes are symmetric and normal bilaterally.  DIAGNOSTIC DATA (LABS, IMAGING, TESTING) -  ASSESSMENT AND PLAN  29 y.o. year old female  has a past medical history of  Seizures (HCC); Asthma; and Hypertension. here to follow-up. Seizure disorder is in good control with no seizures in several years.  PLAN: Continue Keppra at current dose will refill Will check trough level next week Call for any seizure activity Follow-up yearly and when necessary Nilda RiggsNancy Carolyn Martin, [facility] LLCGNP, [facility] CenterBC, APRN  [facility] [address], [address] [place], [place] [address] [phone]

## 2015-01-05 NOTE — Progress Notes (Signed)
I have read the note, and I agree with the clinical assessment and plan.  WILLIS,CHARLES KEITH   

## 2015-01-05 NOTE — Patient Instructions (Signed)
Continue Keppra current dose will refill Will check trough level next week Call for any seizure activity Follow-up yearly and when necessary

## 2015-01-12 ENCOUNTER — Other Ambulatory Visit (HOSPITAL_COMMUNITY)
Admission: RE | Admit: 2015-01-12 | Discharge: 2015-01-12 | Disposition: A | Discharge status: Home / Self Care | Payer: Medicaid Other | Source: Ambulatory Visit | Attending: Family Medicine | Admitting: Family Medicine

## 2015-01-12 ENCOUNTER — Ambulatory Visit: Payer: Medicaid Other | Attending: Family Medicine | Admitting: Family Medicine

## 2015-01-12 ENCOUNTER — Encounter: Payer: Self-pay | Admitting: Family Medicine

## 2015-01-12 ENCOUNTER — Telehealth: Payer: Self-pay | Admitting: Nurse Practitioner

## 2015-01-12 VITALS — BP 119/82 | HR 70 | Temp 99.4°F | Resp 16 | Ht 62.0 in | Wt 138.0 lb

## 2015-01-12 DIAGNOSIS — I10 Essential (primary) hypertension: Secondary | ICD-10-CM | POA: Insufficient documentation

## 2015-01-12 DIAGNOSIS — Z833 Family history of diabetes mellitus: Secondary | ICD-10-CM | POA: Insufficient documentation

## 2015-01-12 DIAGNOSIS — F17210 Nicotine dependence, cigarettes, uncomplicated: Secondary | ICD-10-CM | POA: Insufficient documentation

## 2015-01-12 DIAGNOSIS — Z79899 Other long term (current) drug therapy: Secondary | ICD-10-CM | POA: Diagnosis not present

## 2015-01-12 DIAGNOSIS — N898 Other specified noninflammatory disorders of vagina: Secondary | ICD-10-CM | POA: Diagnosis not present

## 2015-01-12 DIAGNOSIS — Z113 Encounter for screening for infections with a predominantly sexual mode of transmission: Secondary | ICD-10-CM | POA: Insufficient documentation

## 2015-01-12 DIAGNOSIS — Z0000 Encounter for general adult medical examination without abnormal findings: Secondary | ICD-10-CM

## 2015-01-12 DIAGNOSIS — N760 Acute vaginitis: Secondary | ICD-10-CM | POA: Diagnosis present

## 2015-01-12 DIAGNOSIS — G40909 Epilepsy, unspecified, not intractable, without status epilepticus: Secondary | ICD-10-CM | POA: Diagnosis not present

## 2015-01-12 LAB — BASIC METABOLIC PANEL
BUN: 8 mg/dL (ref 7–25)
CHLORIDE: 103 mmol/L (ref 98–110)
CO2: 27 mmol/L (ref 20–31)
CREATININE: 0.79 mg/dL (ref 0.50–1.10)
Calcium: 9.7 mg/dL (ref 8.6–10.2)
GLUCOSE: 88 mg/dL (ref 65–99)
Potassium: 4 mmol/L (ref 3.5–5.3)
Sodium: 140 mmol/L (ref 135–146)

## 2015-01-12 LAB — POCT GLYCOSYLATED HEMOGLOBIN (HGB A1C): Hemoglobin A1C: 5.7

## 2015-01-12 LAB — GLUCOSE, POCT (MANUAL RESULT ENTRY): POC GLUCOSE: 97 mg/dL (ref 70–99)

## 2015-01-12 MED ORDER — METRONIDAZOLE 0.75 % VA GEL: 1.0000 | Freq: Every day | VAGINAL | Status: DC

## 2015-01-12 MED ORDER — HYDROCHLOROTHIAZIDE 25 MG PO TABS: 25.0000 mg | ORAL_TABLET | Freq: Every day | ORAL | Status: DC

## 2015-01-12 NOTE — Addendum Note (Signed)
Addended by: Dessa PhiFUNCHES, Sukari Grist on: 01/12/2015 11:59 AM   Modules accepted: Orders, SmartSet

## 2015-01-12 NOTE — Progress Notes (Signed)
Patient ID: Rhonda Kennedy, female   DOB: [birth year], 29 y.o.   MRN: [id number]   Subjective:  Patient ID: Rhonda Kennedy, female    DOB: [birth year]  Age: 29 y.o. MRN: [id number]  CC: Annual Exam and Vaginal Discharge   HPI Rhonda Kennedy presents for    Wellness visit  Vaginal discharge: discharge with odor. No lesions or pelvic pain. OCP. Intermittent condoms. Partner ejaculates inside at times. Has hx of recurrent BV.  HTN: taking HCTZ. Intermittent HA. No CP, SOB or swelling.   Social History  Substance Use Topics  . Smoking status: Current Every Day Smoker -- 0.25 packs/day for 10 years    Types: Cigarettes  . Smokeless tobacco: Never Used  . Alcohol Use: 1.8 oz/week    3 Standard drinks or equivalent per week     Comment: per week    Outpatient Prescriptions Prior to Visit  Medication Sig Dispense Refill  . albuterol (PROVENTIL HFA;VENTOLIN HFA) 108 (90 BASE) MCG/ACT inhaler Inhale 2 puffs into the lungs every 4 (four) hours as needed for wheezing. 1 Inhaler 0  . fexofenadine (ALLEGRA) 60 MG tablet Take 1 tablet (60 mg total) by mouth 2 (two) times daily. 30 tablet 1  . hydrochlorothiazide (HYDRODIURIL) 25 MG tablet Take 1 tablet (25 mg total) by mouth daily. 90 tablet 0  . levETIRAcetam (KEPPRA) 500 MG tablet Take 1 tablet (500 mg total) by mouth daily. 30 tablet 11  . norgestimate-ethinyl estradiol (ORTHO-CYCLEN,SPRINTEC,PREVIFEM) 0.25-35 MG-MCG tablet Take 1 tablet by mouth daily. 1 Package 11  . CRYSELLE-28 0.3-30 MG-MCG tablet Take 1 tablet by mouth daily. as directed  0  . nicotine (NICODERM CQ - DOSED IN MG/24 HR) 7 mg/24hr patch Place 1 patch (7 mg total) onto the skin daily. (Patient not taking: Reported on 01/12/2015) 28 patch 0   No facility-administered medications prior to visit.    ROS Review of Systems  Constitutional: Positive for fatigue. Negative for fever and chills.  HENT: Negative for congestion, dental problem, ear discharge, ear pain,  hearing loss, sinus pressure, sneezing, sore throat and trouble swallowing.   Eyes: Negative for visual disturbance.  Respiratory: Negative for shortness of breath.   Cardiovascular: Negative for chest pain.  Gastrointestinal: Negative for abdominal pain and blood in stool.  Genitourinary: Positive for vaginal discharge. Negative for dysuria, urgency, frequency, hematuria, flank pain, decreased urine volume, vaginal bleeding, enuresis, difficulty urinating, genital sores, vaginal pain, menstrual problem, pelvic pain and dyspareunia.  Musculoskeletal: Negative for back pain and arthralgias.  Skin: Negative for rash.  Allergic/Immunologic: Negative for immunocompromised state.  Neurological: Positive for numbness and headaches. Negative for dizziness, tremors, seizures, syncope, facial asymmetry, speech difficulty, weakness and light-headedness.  Hematological: Negative for adenopathy. Does not bruise/bleed easily.  Psychiatric/Behavioral: Negative for suicidal ideas and dysphoric mood.    Objective:  BP 119/82 mmHg  Pulse 70  Temp(Src) 99.4 F (37.4 C) (Oral)  Resp 16  Ht 5\' 2"  (1.575 m)  Wt 138 lb (62.596 kg)  BMI 25.23 kg/m2  SpO2 98%  LMP 12/22/2014  BP/Weight 01/12/2015 01/05/2015 09/03/2014  Systolic BP 119 127 137  Diastolic BP 82 86 101  Wt. (Lbs) 138 141.6 143.8  BMI 25.23 24.29 26.29    Physical Exam  Constitutional: She appears well-developed and well-nourished. No distress.  HENT:  Head:    Nose: Mucosal edema present.  Cardiovascular: Normal rate, regular rhythm, normal heart sounds and intact distal pulses.   Pulmonary/Chest: Effort normal and breath sounds normal.  Abdominal: Soft. Bowel sounds are normal. She exhibits no distension and no mass. There is no tenderness. There is no rebound and no guarding.  Genitourinary: Uterus normal. Pelvic exam was performed with patient prone. There is no rash, tenderness or lesion on the right labia. There is no rash,  tenderness or lesion on the left labia. Cervix exhibits discharge. Cervix exhibits no motion tenderness and no friability. No erythema or tenderness in the vagina. No signs of injury around the vagina. Vaginal discharge found.  Scant white homogenous discharge with scant odor   Musculoskeletal: She exhibits no edema.  Lymphadenopathy:       Right: No inguinal adenopathy present.       Left: No inguinal adenopathy present.  Skin: Skin is warm and dry. No rash noted.    Assessment & Plan:   Problem List Items Addressed This Visit    Essential hypertension, benign (Chronic)   Relevant Medications   hydrochlorothiazide (HYDRODIURIL) 25 MG tablet   Other Relevant Orders   Basic Metabolic Panel   Family history of diabetes mellitus in maternal grandmother - Primary (Chronic)   Relevant Orders   Glucose (CBG)   HgB A1c    Other Visit Diagnoses    Vaginal discharge        Relevant Medications    metroNIDAZOLE (METROGEL VAGINAL) 0.75 % vaginal gel    Other Relevant Orders    Cervicovaginal ancillary only       No orders of the defined types were placed in this encounter.    Follow-up: Return in about 1 year (around 01/12/2016).   Dessa Phi MD

## 2015-01-12 NOTE — Telephone Encounter (Signed)
Patient called to advise that when she saw Eber JonesCarolyn 01/05/15, Eber JonesCarolyn wanted her to have keppra level checked today. Patient states she had labs [facility] at [facility] and had keppra level drawn there. Patient wants to know if Eber JonesCarolyn can access those labs so that she doesn't have to come in.

## 2015-01-12 NOTE — Telephone Encounter (Signed)
I called and spoke to pt and she stated since she was going to have labs drawn at her pcp, she went ahead and drew for the keppra level as well.  She did not take her am dose until after she had the labs drawn  (this was about 1200).  I told her that most likely  Dr. Armen PickupFunches will call her with results, but we have access to these results.  Will keep watch for keppra level.  If any change will call her.  She verbalized understanding.

## 2015-01-12 NOTE — Progress Notes (Signed)
C/C vaginal discharge, white, light odor   Heartburn  No pain  Hx tobacco 6 cigarette per day No suicidal thought in the past two weeks

## 2015-01-12 NOTE — Patient Instructions (Signed)
Rhonda Kennedy was seen today for annual exam and vaginal discharge.  Diagnoses and all orders for this visit:  Family history of diabetes mellitus in maternal grandmother -     Glucose (CBG) -     HgB A1c  Essential hypertension, benign -     Basic Metabolic Panel -     hydrochlorothiazide (HYDRODIURIL) 25 MG tablet; Take 1 tablet (25 mg total) by mouth daily.  Vaginal discharge -     Cervicovaginal ancillary only -     metroNIDAZOLE (METROGEL VAGINAL) 0.75 % vaginal gel; Place 1 Applicatorful vaginally at bedtime. For 5 days   You will be called with results  F/u in one year, sooner if needed  Dr. Armen PickupFunches

## 2015-01-13 LAB — CERVICOVAGINAL ANCILLARY ONLY
Chlamydia: NEGATIVE
Neisseria Gonorrhea: NEGATIVE
Trichomonas: NEGATIVE
WET PREP (BD AFFIRM): POSITIVE — AB

## 2015-01-14 LAB — LEVETIRACETAM LEVEL: Keppra (Levetiracetam): <1 ug/mL

## 2015-10-05 ENCOUNTER — Other Ambulatory Visit: Payer: Self-pay | Admitting: Family Medicine

## 2015-11-02 ENCOUNTER — Other Ambulatory Visit: Payer: Self-pay | Admitting: Family Medicine

## 2015-11-23 ENCOUNTER — Other Ambulatory Visit: Payer: Self-pay | Admitting: Family Medicine

## 2015-11-23 ENCOUNTER — Telehealth: Payer: Self-pay | Admitting: *Deleted

## 2015-11-23 DIAGNOSIS — Z3041 Encounter for surveillance of contraceptive pills: Secondary | ICD-10-CM

## 2015-11-23 MED ORDER — NORGESTIMATE-ETH ESTRADIOL 0.25-35 MG-MCG PO TABS: 1.0000 | ORAL_TABLET | Freq: Every day | ORAL | 3 refills | Status: DC

## 2015-11-23 NOTE — Telephone Encounter (Signed)
Pt called requesting a refill on her birth control pills. Rx to pharmacy for 3 months supply. Attempted to call patient but her voicemail is full.

## 2015-12-14 ENCOUNTER — Ambulatory Visit (INDEPENDENT_AMBULATORY_CARE_PROVIDER_SITE_OTHER): Payer: Medicaid Other | Admitting: Family Medicine

## 2015-12-14 ENCOUNTER — Other Ambulatory Visit (HOSPITAL_COMMUNITY)
Admission: RE | Admit: 2015-12-14 | Discharge: 2015-12-14 | Disposition: A | Discharge status: Home / Self Care | Payer: Medicaid Other | Source: Ambulatory Visit | Attending: Family Medicine | Admitting: Family Medicine

## 2015-12-14 ENCOUNTER — Encounter: Payer: Self-pay | Admitting: Family Medicine

## 2015-12-14 VITALS — BP 126/85 | HR 74 | Wt 133.4 lb

## 2015-12-14 DIAGNOSIS — Z113 Encounter for screening for infections with a predominantly sexual mode of transmission: Secondary | ICD-10-CM | POA: Insufficient documentation

## 2015-12-14 DIAGNOSIS — Z309 Encounter for contraceptive management, unspecified: Secondary | ICD-10-CM | POA: Diagnosis not present

## 2015-12-14 DIAGNOSIS — Z1151 Encounter for screening for human papillomavirus (HPV): Secondary | ICD-10-CM | POA: Insufficient documentation

## 2015-12-14 DIAGNOSIS — Z124 Encounter for screening for malignant neoplasm of cervix: Secondary | ICD-10-CM | POA: Diagnosis not present

## 2015-12-14 DIAGNOSIS — Z01419 Encounter for gynecological examination (general) (routine) without abnormal findings: Secondary | ICD-10-CM | POA: Insufficient documentation

## 2015-12-14 DIAGNOSIS — Z3041 Encounter for surveillance of contraceptive pills: Secondary | ICD-10-CM | POA: Diagnosis not present

## 2015-12-14 LAB — HIV ANTIBODY (ROUTINE TESTING W REFLEX): HIV: NONREACTIVE

## 2015-12-14 LAB — HEPATITIS B SURFACE ANTIGEN: HEP B S AG: NEGATIVE

## 2015-12-14 MED ORDER — NORGESTIMATE-ETH ESTRADIOL 0.25-35 MG-MCG PO TABS: 1.0000 | ORAL_TABLET | Freq: Every day | ORAL | 11 refills | Status: DC

## 2015-12-14 NOTE — Addendum Note (Signed)
Addended by: Levie HeritageSTINSON, JACOB J on: 12/14/2015 03:09 PM   Modules accepted: Orders

## 2015-12-14 NOTE — Progress Notes (Signed)
Subjective:     Rhonda Kennedy is a 30 y.o. female and is here for a comprehensive physical exam. The patient reports sore throat. Had unprotected sex, since then has had a sore throat..  Contraception: ortho cyclin. No problems. Has intermittent sexual partner. Usually uses condoms. Last PAP in 2015. Normal.   Social History   Social History  . Marital status: Single    Spouse name: N/A  . Number of children: N/A  . Years of education: N/A   Occupational History  . Not on file.   Social History Main Topics  . Smoking status: Current Every Day Smoker    Packs/day: 0.25    Years: 10.00    Types: Cigarettes  . Smokeless tobacco: Never Used  . Alcohol use 1.8 oz/week    3 Standard drinks or equivalent per week     Comment: per week  . Drug use: No     Comment: marijuana last used 01/09/2015  . Sexual activity: Not on file   Other Topics Concern  . Not on file   Social History Narrative  . No narrative on file   Health Maintenance  Topic Date Due  . TETANUS/TDAP  10/10/2004  . INFLUENZA VACCINE  09/27/2015  . PAP SMEAR  02/08/2017  . HIV Screening  Completed    The following portions of the patient's history were reviewed and updated as appropriate: allergies, current medications, past family history, past medical history, past social history, past surgical history and problem list.  Review of Systems Pertinent items noted in HPI and remainder of comprehensive ROS otherwise negative.   Objective:   General Appearance:    Alert, cooperative, no distress, appears stated age  Head:    Normocephalic, without obvious abnormality, atraumatic  Eyes:    PERRL, conjunctiva/corneas clear, EOM's intact, fundi    benign, both eyes  Throat:   Lips, mucosa, and tongue normal; teeth and gums normal  Neck:   Supple, symmetrical, trachea midline, no adenopathy;    thyroid:  no enlargement/tenderness/nodules; no carotid   bruit or JVD  Back:     Symmetric, no curvature, ROM  normal, no CVA tenderness  Lungs:     Clear to auscultation bilaterally, respirations unlabored  Chest Wall:    No tenderness or deformity   Heart:    Regular rate and rhythm, S1 and S2 normal, no murmur, rub   or gallop  Breast Exam:    No tenderness, masses, or nipple abnormality  Abdomen:     Soft, non-tender, bowel sounds active all four quadrants,    no masses, no organomegaly  Genitalia:    Normal female without lesion, discharge or tenderness.         Vaginal rugae normal.  Cervix normal in appearance.  Uterus   normal in size.  Adnexa normal, no masses or fullness   palpated.  Extremities:   Extremities normal, atraumatic, no cyanosis or edema  Pulses:   2+ and symmetric all extremities  Skin:   Skin color, texture, turgor normal, no rashes or lesions  Lymph nodes:   Cervical, supraclavicular, and axillary nodes normal  Neurologic:   CNII-XII intact, normal strength, sensation and reflexes    throughout      Assessment:    Healthy female exam.      Plan:      Discussed missing pills.  Discussed STD prevention Discussed healthy diet, exercise. PAP done STD screening done See After Visit Summary for Counseling Recommendations

## 2015-12-15 LAB — RPR

## 2015-12-16 LAB — CYTOLOGY - PAP
Diagnosis: NEGATIVE
HPV: NOT DETECTED

## 2015-12-16 LAB — GC/CHLAMYDIA PROBE AMP (~~LOC~~) NOT AT ARMC
CHLAMYDIA, DNA PROBE: NEGATIVE
NEISSERIA GONORRHEA: NEGATIVE

## 2015-12-17 LAB — CULTURE, UPPER RESPIRATORY: Organism ID, Bacteria: NORMAL

## 2016-01-04 ENCOUNTER — Ambulatory Visit: Payer: Medicaid Other | Admitting: Nurse Practitioner

## 2016-01-05 ENCOUNTER — Encounter: Payer: Self-pay | Admitting: Nurse Practitioner

## 2016-01-05 ENCOUNTER — Telehealth: Payer: Self-pay | Admitting: *Deleted

## 2016-01-05 NOTE — Telephone Encounter (Signed)
LMVM for pt on mobile.  Calling about rescheduling missed appt.  Please return call.

## 2016-01-18 ENCOUNTER — Other Ambulatory Visit: Payer: Self-pay | Admitting: Family Medicine

## 2016-01-18 DIAGNOSIS — I10 Essential (primary) hypertension: Secondary | ICD-10-CM

## 2016-02-22 ENCOUNTER — Other Ambulatory Visit: Payer: Self-pay | Admitting: Family Medicine

## 2016-02-22 DIAGNOSIS — I10 Essential (primary) hypertension: Secondary | ICD-10-CM

## 2016-03-29 ENCOUNTER — Other Ambulatory Visit: Payer: Self-pay | Admitting: Family Medicine

## 2016-03-29 DIAGNOSIS — I10 Essential (primary) hypertension: Secondary | ICD-10-CM

## 2016-08-01 ENCOUNTER — Ambulatory Visit: Payer: Medicaid Other | Admitting: Obstetrics and Gynecology

## 2016-09-13 ENCOUNTER — Encounter: Payer: Self-pay | Admitting: Family Medicine

## 2016-09-13 ENCOUNTER — Other Ambulatory Visit (HOSPITAL_COMMUNITY)
Admission: RE | Admit: 2016-09-13 | Discharge: 2016-09-13 | Disposition: A | Discharge status: Home / Self Care | Payer: No Typology Code available for payment source | Source: Ambulatory Visit | Attending: Family Medicine | Admitting: Family Medicine

## 2016-09-13 ENCOUNTER — Ambulatory Visit: Payer: No Typology Code available for payment source | Attending: Family Medicine | Admitting: Family Medicine

## 2016-09-13 VITALS — BP 153/94 | HR 102 | Temp 98.4°F | Ht 62.0 in | Wt 137.2 lb

## 2016-09-13 DIAGNOSIS — F17210 Nicotine dependence, cigarettes, uncomplicated: Secondary | ICD-10-CM | POA: Insufficient documentation

## 2016-09-13 DIAGNOSIS — B9689 Other specified bacterial agents as the cause of diseases classified elsewhere: Secondary | ICD-10-CM | POA: Diagnosis not present

## 2016-09-13 DIAGNOSIS — R21 Rash and other nonspecific skin eruption: Secondary | ICD-10-CM | POA: Diagnosis not present

## 2016-09-13 DIAGNOSIS — A5901 Trichomonal vulvovaginitis: Secondary | ICD-10-CM | POA: Diagnosis not present

## 2016-09-13 DIAGNOSIS — Z113 Encounter for screening for infections with a predominantly sexual mode of transmission: Secondary | ICD-10-CM

## 2016-09-13 DIAGNOSIS — J45909 Unspecified asthma, uncomplicated: Secondary | ICD-10-CM | POA: Diagnosis not present

## 2016-09-13 DIAGNOSIS — I10 Essential (primary) hypertension: Secondary | ICD-10-CM

## 2016-09-13 DIAGNOSIS — N760 Acute vaginitis: Secondary | ICD-10-CM | POA: Diagnosis not present

## 2016-09-13 DIAGNOSIS — Z3041 Encounter for surveillance of contraceptive pills: Secondary | ICD-10-CM

## 2016-09-13 DIAGNOSIS — G40909 Epilepsy, unspecified, not intractable, without status epilepticus: Secondary | ICD-10-CM | POA: Diagnosis not present

## 2016-09-13 DIAGNOSIS — A749 Chlamydial infection, unspecified: Secondary | ICD-10-CM | POA: Diagnosis not present

## 2016-09-13 DIAGNOSIS — Z79899 Other long term (current) drug therapy: Secondary | ICD-10-CM | POA: Diagnosis not present

## 2016-09-13 MED ORDER — HYDROCHLOROTHIAZIDE 25 MG PO TABS: 25.0000 mg | ORAL_TABLET | Freq: Every day | ORAL | 11 refills | Status: DC

## 2016-09-13 MED ORDER — KETOCONAZOLE 2 % EX CREA: 1.0000 "application " | TOPICAL_CREAM | Freq: Two times a day (BID) | CUTANEOUS | 0 refills | Status: DC

## 2016-09-13 MED ORDER — NORGESTIMATE-ETH ESTRADIOL 0.25-35 MG-MCG PO TABS: 1.0000 | ORAL_TABLET | Freq: Every day | ORAL | 11 refills | Status: DC

## 2016-09-13 NOTE — Assessment & Plan Note (Signed)
Hypertensive today No HCTZ HCTZ 25 mg daily refilled

## 2016-09-13 NOTE — Patient Instructions (Addendum)
Rhonda Kennedy was seen today for sexually transmitted disease.  Diagnoses and all orders for this visit:  Routine screening for STI (sexually transmitted infection) -     Urine cytology ancillary only -     Cervicovaginal ancillary only -     HIV antibody (with reflex) -     RPR -     POCT urine pregnancy  Essential hypertension, benign -     hydrochlorothiazide (HYDRODIURIL) 25 MG tablet; Take 1 tablet (25 mg total) by mouth daily.  Rash -     ketoconazole (NIZORAL) 2 % cream; Apply 1 application topically 2 (two) times daily.   I recommend repeat HIV screening in 45 days  after unprotected sex  to confirm negative status Warm compresses on mons for 20 minutes will help reduce the bump. If you can see an ingrown hair you it will be helpful to pluck it with clean tweezers. I advise using scissors or electric trimmers to trim pubic area.   You will be called with results   F/u in  3 months for HTN   Dr. Armen PickupFunches

## 2016-09-13 NOTE — Progress Notes (Signed)
Subjective:  Patient ID: Rhonda Kennedy, female    DOB: [birth year]  Age: 31 y.o. MRN: [id number]  CC: SEXUALLY TRANSMITTED DISEASE (screening )   HPI Rhonda Kennedy has history of hypertension, seizure disorder, asthma she is a smoker she presents for   1. STI screening: she denies vaginal discharge. She has unprotected sex last week. She reports pruritic  rash on abdomen and bump in vaginal area. She does shave her pubic hair.  2. HTN: not taking HCTZ. No HA, CP or SOB.   Social History  Substance Use Topics  . Smoking status: Current Every Day Smoker    Packs/day: 0.25    Years: 10.00    Types: Cigarettes  . Smokeless tobacco: Never Used  . Alcohol use 1.8 oz/week    3 Standard drinks or equivalent per week     Comment: per week    Outpatient Medications Prior to Visit  Medication Sig Dispense Refill  . albuterol (PROVENTIL HFA;VENTOLIN HFA) 108 (90 BASE) MCG/ACT inhaler Inhale 2 puffs into the lungs every 4 (four) hours as needed for wheezing. 1 Inhaler 0  . fexofenadine (ALLEGRA) 60 MG tablet Take 1 tablet (60 mg total) by mouth 2 (two) times daily. 30 tablet 1  . hydrochlorothiazide (HYDRODIURIL) 25 MG tablet Take 1 tablet (25 mg total) by mouth daily. 30 tablet 11  . norgestimate-ethinyl estradiol (ORTHO-CYCLEN,SPRINTEC,PREVIFEM) 0.25-35 MG-MCG tablet Take 1 tablet by mouth daily. 1 Package 11   No facility-administered medications prior to visit.     ROS Review of Systems  Constitutional: Negative for chills and fever.  Eyes: Negative for visual disturbance.  Respiratory: Negative for shortness of breath.   Cardiovascular: Negative for chest pain.  Gastrointestinal: Negative for abdominal pain and blood in stool.  Genitourinary: Positive for vaginal discharge.  Musculoskeletal: Negative for arthralgias and back pain.  Skin: Negative for rash.  Allergic/Immunologic: Negative for immunocompromised state.  Hematological: Negative for adenopathy. Does not  bruise/bleed easily.  Psychiatric/Behavioral: Negative for dysphoric mood and suicidal ideas.    Objective:  BP (!) 153/94   Pulse (!) 102   Temp 98.4 F (36.9 C) (Oral)   Ht '5\' 2"'  (1.575 m)   Wt 137 lb 3.2 oz (62.2 kg)   SpO2 100%   BMI 25.09 kg/m   BP/Weight 09/13/2016 12/14/2015 56/38/7564  Systolic BP 332 951 884  Diastolic BP 94 85 82  Wt. (Lbs) 137.2 133.4 138  BMI 25.09 24.4 25.23    Physical Exam  Constitutional: She appears well-developed and well-nourished. No distress.  Cardiovascular: Normal rate, regular rhythm, normal heart sounds and intact distal pulses.   Pulmonary/Chest: Effort normal and breath sounds normal.  Genitourinary: Vagina normal and uterus normal.    Pelvic exam was performed with patient supine. There is no rash, tenderness or lesion on the right labia. There is no rash, tenderness or lesion on the left labia. Cervix exhibits discharge. Cervix exhibits no motion tenderness and no friability.    Musculoskeletal: She exhibits no edema.  Lymphadenopathy:       Right: No inguinal adenopathy present.       Left: No inguinal adenopathy present.  Skin: Skin is warm and dry. No rash noted.       Assessment & Plan:  Rhonda Kennedy was seen today for sexually transmitted disease.  Diagnoses and all orders for this visit:  Routine screening for STI (sexually transmitted infection) -     Urine cytology ancillary only -     Cervicovaginal ancillary  only -     HIV antibody (with reflex) -     RPR -     POCT urine pregnancy  Essential hypertension, benign -     hydrochlorothiazide (HYDRODIURIL) 25 MG tablet; Take 1 tablet (25 mg total) by mouth daily. -     BMP8+EGFR  Rash -     ketoconazole (NIZORAL) 2 % cream; Apply 1 application topically 2 (two) times daily.  Encounter for birth control pills maintenance -     norgestimate-ethinyl estradiol (ORTHO-CYCLEN,SPRINTEC,PREVIFEM) 0.25-35 MG-MCG tablet; Take 1 tablet by mouth daily.   There are no  diagnoses linked to this encounter.  No orders of the defined types were placed in this encounter.   Follow-up: Return in about 3 months (around 12/14/2016) for HTN.   Boykin Nearing MD

## 2016-09-13 NOTE — Assessment & Plan Note (Signed)
Unprotected sex 2 weeks ago No s/s of STI Screening done today

## 2016-09-14 ENCOUNTER — Telehealth: Payer: Self-pay

## 2016-09-14 LAB — URINE CYTOLOGY ANCILLARY ONLY
Chlamydia: POSITIVE — AB
Neisseria Gonorrhea: NEGATIVE
TRICH (WINDOWPATH): POSITIVE — AB

## 2016-09-14 LAB — CERVICOVAGINAL ANCILLARY ONLY
BACTERIAL VAGINITIS: POSITIVE — AB
CANDIDA VAGINITIS: NEGATIVE

## 2016-09-14 MED ORDER — FLUCONAZOLE 150 MG PO TABS: 150.0000 mg | ORAL_TABLET | Freq: Once | ORAL | 0 refills | Status: AC

## 2016-09-14 MED ORDER — METRONIDAZOLE 500 MG PO TABS: 500.0000 mg | ORAL_TABLET | Freq: Two times a day (BID) | ORAL | 0 refills | Status: DC

## 2016-09-14 NOTE — Telephone Encounter (Signed)
Pt was called and a VM was left informing pt to return phone call. 

## 2016-09-14 NOTE — Addendum Note (Signed)
Addended by: Dessa PhiFUNCHES, Jaray Boliver on: 09/14/2016 05:15 PM   Modules accepted: Orders

## 2016-09-17 ENCOUNTER — Telehealth: Payer: Self-pay

## 2016-09-17 NOTE — Telephone Encounter (Signed)
Pt was called and a VM was left informing pt to return phone call for lab results. 

## 2016-09-18 LAB — BMP8+EGFR
BUN / CREAT RATIO: 9 (ref 9–23)
BUN: 7 mg/dL (ref 6–20)
CO2: 23 mmol/L (ref 20–29)
CREATININE: 0.78 mg/dL (ref 0.57–1.00)
Calcium: 9.3 mg/dL (ref 8.7–10.2)
Chloride: 103 mmol/L (ref 96–106)
GFR, EST AFRICAN AMERICAN: 118 mL/min/{1.73_m2} (ref >59)
GFR, EST NON AFRICAN AMERICAN: 102 mL/min/{1.73_m2} (ref >59)
Glucose: 93 mg/dL (ref 65–99)
Potassium: 4 mmol/L (ref 3.5–5.2)
SODIUM: 141 mmol/L (ref 134–144)

## 2016-09-18 LAB — HIV ANTIBODY (ROUTINE TESTING W REFLEX): HIV Screen 4th Generation wRfx: NONREACTIVE

## 2016-09-18 LAB — RPR: RPR Ser Ql: NONREACTIVE

## 2016-09-19 ENCOUNTER — Ambulatory Visit: Payer: No Typology Code available for payment source | Attending: Family Medicine | Admitting: Physician Assistant

## 2016-09-19 ENCOUNTER — Telehealth: Payer: Self-pay | Admitting: Family Medicine

## 2016-09-19 VITALS — BP 137/97 | HR 81 | Temp 98.6°F | Resp 16

## 2016-09-19 DIAGNOSIS — R21 Rash and other nonspecific skin eruption: Secondary | ICD-10-CM | POA: Diagnosis not present

## 2016-09-19 DIAGNOSIS — A599 Trichomoniasis, unspecified: Secondary | ICD-10-CM | POA: Insufficient documentation

## 2016-09-19 DIAGNOSIS — A568 Sexually transmitted chlamydial infection of other sites: Secondary | ICD-10-CM | POA: Diagnosis present

## 2016-09-19 DIAGNOSIS — J45909 Unspecified asthma, uncomplicated: Secondary | ICD-10-CM | POA: Diagnosis not present

## 2016-09-19 DIAGNOSIS — K644 Residual hemorrhoidal skin tags: Secondary | ICD-10-CM | POA: Diagnosis not present

## 2016-09-19 DIAGNOSIS — A64 Unspecified sexually transmitted disease: Secondary | ICD-10-CM | POA: Diagnosis not present

## 2016-09-19 DIAGNOSIS — I10 Essential (primary) hypertension: Secondary | ICD-10-CM | POA: Diagnosis not present

## 2016-09-19 DIAGNOSIS — K921 Melena: Secondary | ICD-10-CM

## 2016-09-19 MED ORDER — AZITHROMYCIN 500 MG PO TABS
1000.0000 mg | ORAL_TABLET | Freq: Once | ORAL | Status: AC
  Administered 2016-09-19: 1000 mg via ORAL

## 2016-09-19 NOTE — Telephone Encounter (Signed)
Pt concerns addressed at nurse/office visit

## 2016-09-19 NOTE — Progress Notes (Signed)
Rhonda Kennedy, is a 31 y.o. female  ZOX:[id number]  [id number]  DOB - 1986/02/04  Subjective:  Chief Complaint and HPI: Rhonda Kennedy is a 31 y.o. female here today for recent STI-chlamydia and trichomonas.  On metronidazole and came in to get 1000mg  azithromycin.  Also c/o to the nurse of rash on lower abdomen for a few weeks since the sexual enctounter that resulted in these STI.  He ejaculated in this area.  Rash with itching since.  No new soaps or detergents.  No f/c.    Also had Bright red blood in the toilet this morning with a BM.  She thought she was starting her period but then realized it was coming from her rectum.  The blood was bright red.  She does have a h/o hemorrhoids.  No pain with defecation.  No anemia since pregnancy many years ago.    ROS:   Constitutional:  No f/c, No night sweats, No unexplained weight loss. EENT:  No vision changes, No blurry vision, No hearing changes. No mouth, throat, or ear problems.  Respiratory: No cough, No SOB Cardiac: No CP, no palpitations GI:  No abd pain, No N/V/D. GU: No Urinary s/sx Musculoskeletal: No joint pain Neuro: No headache, no dizziness, no motor weakness.  Skin: + rash Endocrine:  No polydipsia. No polyuria.  Psych: Denies SI/HI  No problems updated.  ALLERGIES: No Known Allergies  PAST MEDICAL HISTORY: Past Medical History:  Diagnosis Date  . Asthma    Diagnosed as child  . Hemorrhoids   . History of bronchitis   . Hypertension    2012  . Seizures (HCC)    Onset 31 yo    MEDICATIONS AT HOME: Prior to Admission medications   Medication Sig Start Date End Date Taking? Authorizing Provider  albuterol (PROVENTIL HFA;VENTOLIN HFA) 108 (90 BASE) MCG/ACT inhaler Inhale 2 puffs into the lungs every 4 (four) hours as needed for wheezing. 07/27/12  Yes Baker, Zachary H, PA-C  fexofenadine (ALLEGRA) 60 MG tablet Take 1 tablet (60 mg total) by mouth 2 (two) times daily. 06/26/13  Yes Mabe, Onalee Huaavid, NP    hydrochlorothiazide (HYDRODIURIL) 25 MG tablet Take 1 tablet (25 mg total) by mouth daily. 09/13/16  Yes Funches, Josalyn, MD  ketoconazole (NIZORAL) 2 % cream Apply 1 application topically 2 (two) times daily. 09/13/16  Yes Funches, Josalyn, MD  metroNIDAZOLE (FLAGYL) 500 MG tablet Take 1 tablet (500 mg total) by mouth 2 (two) times daily. 09/14/16  Yes Funches, Josalyn, MD  norgestimate-ethinyl estradiol (ORTHO-CYCLEN,SPRINTEC,PREVIFEM) 0.25-35 MG-MCG tablet Take 1 tablet by mouth daily. 09/13/16  Yes Funches, Gerilyn NestleJosalyn, MD     Objective:  EXAM:   Vitals:   09/19/16 1148  BP: (!) 137/97  Pulse: 81  Resp: 16  Temp: 98.6 F (37 C)  TempSrc: Oral  SpO2: 99%    General appearance : A&OX3. NAD. Non-toxic-appearing HEENT: Atraumatic and Normocephalic.  PERRLA. EOM intact.  TM clear B. Mouth-MMM, post pharynx WNL w/o erythema, No PND. Neck: supple, no JVD. No cervical lymphadenopathy. No thyromegaly Chest/Lungs:  Breathing-non-labored, Good air entry bilaterally, breath sounds normal without rales, rhonchi, or wheezing  CVS: S1 S2 regular, no murmurs, gallops, rubs  Anal region-3 small external hemorrhoids-the most inferior one has a small amount of blood on it.  No fissures or other abnormalities.  No digital exam or heme occult done due to obvious source Extremities: Bilateral Lower Ext shows no edema, both legs are warm to touch with = pulse throughout Neurology:  CN II-XII grossly intact, Non focal.   Psych:  TP linear. J/I WNL. Normal speech. Appropriate eye contact and affect.  Skin:  Appears irritated on lower abdomen.  No discreet lesions.  Data Review Lab Results  Component Value Date   HGBA1C 5.70 01/12/2015     Assessment & Plan   1. STI (sexually transmitted infection)(chlamydia and trich) Already on metronidazole - azithromycin (ZITHROMAX) tablet 1,000 mg; Take 2 tablets (1,000 mg total) by mouth once. Harle StanfordAlycia Farrington notifying health dept.  2. Rash -likely  allergic contact dermatitis  Hydrocortisone cream and zyrtec  3. Blood in stool 3 Small external hemorrhoids-likely will resolve.  Use OTC products if she has any discomfort.  Increase water and fiber.  - CBC with Differential/Platelet   Patient have been counseled extensively about nutrition and exercise  Return if symptoms worsen or fail to improve.  The patient was given clear instructions to go to ER or return to medical center if symptoms don't improve, worsen or new problems develop. The patient verbalized understanding. The patient was told to call to get lab results if they haven't heard anything in the next week.     Georgian CoAngela Marvell Stavola, PA-C [facility] [place], [place] [phone]   09/19/2016, 12:17 PMPatient ID: Rhonda Kennedy, female   DOB: November 07, 1985, 31 y.o.   MRN: [id number]

## 2016-09-19 NOTE — Telephone Encounter (Signed)
Pt. Called stating that she has an appointment for today to see the nurse but she woke up this morning and there was blood in her stool. Pt. Wanted to know if she could be seen today and was told that when she came in to see the nurse she could be triaged. Pt. Got upset and hanged up the phone.

## 2016-09-19 NOTE — Telephone Encounter (Signed)
Called pt. Concerning a complaint. Pt. States that on her last office visit her PCP was rude and did not want to come in and see her. Pt. Also states that her PCP told her to "swab yourself" for the testing that she wanted to have done. Pt. Also states that the customer service at the front desk is bad and that the front desk personnel needs to be more compassionate when speaking on the phone.

## 2016-09-19 NOTE — Progress Notes (Signed)
Pt here for treatment for STI Order to follow per Dr. Armen PickupFunches note:   Urine cytology positive for chlamydia and trichomonas There is also BV Flagyl 500 mg BID for 7 days ordered for trichomonas and BV, sent to pharmacy Please come in for nurse visit for azithromycin 1 gram PO x one for chlamydia  No sex until treatment and 7 days after  While pt in office she expresses other concerns:  Rash on abdomen Noticed blood in stool this morning.

## 2016-09-20 LAB — CBC WITH DIFFERENTIAL/PLATELET
BASOS ABS: 0.1 10*3/uL (ref 0.0–0.2)
Basos: 1 %
EOS (ABSOLUTE): 0.1 10*3/uL (ref 0.0–0.4)
Eos: 2 %
Hematocrit: 45.1 % (ref 34.0–46.6)
Hemoglobin: 14.9 g/dL (ref 11.1–15.9)
IMMATURE GRANS (ABS): 0 10*3/uL (ref 0.0–0.1)
Immature Granulocytes: 0 %
LYMPHS ABS: 1.8 10*3/uL (ref 0.7–3.1)
Lymphs: 26 %
MCH: 28.7 pg (ref 26.6–33.0)
MCHC: 33 g/dL (ref 31.5–35.7)
MCV: 87 fL (ref 79–97)
MONOS ABS: 0.6 10*3/uL (ref 0.1–0.9)
Monocytes: 8 %
Neutrophils Absolute: 4.5 10*3/uL (ref 1.4–7.0)
Neutrophils: 63 %
PLATELETS: 354 10*3/uL (ref 150–379)
RBC: 5.19 x10E6/uL (ref 3.77–5.28)
RDW: 13.3 % (ref 12.3–15.4)
WBC: 7.1 10*3/uL (ref 3.4–10.8)

## 2016-11-07 ENCOUNTER — Telehealth: Payer: Self-pay | Admitting: Family Medicine

## 2016-11-07 ENCOUNTER — Other Ambulatory Visit: Payer: Self-pay | Admitting: Physician Assistant

## 2016-11-07 DIAGNOSIS — R21 Rash and other nonspecific skin eruption: Secondary | ICD-10-CM

## 2016-11-07 NOTE — Telephone Encounter (Signed)
Please call her and let her know I have placed a referral for dermatology regarding her rash.   Thanks, Georgian CoAngela Maricsa Sammons, PA-C

## 2016-11-07 NOTE — Telephone Encounter (Signed)
Pt. Called requesting to be referred out to the Dermatologist regarding a rash that she has.  Pt. Was seen on 09/19/16 and was told if her rash did not go away she would need to be referred out. Please f/u

## 2016-11-08 NOTE — Telephone Encounter (Signed)
MA unable to reach the patient or leave a voice message. Please inform patient of dermatology referral being placed. Patient will be contacted within the next 2 weeks with an initial appointment.

## 2016-12-06 ENCOUNTER — Ambulatory Visit: Payer: No Typology Code available for payment source | Admitting: Family Medicine

## 2016-12-12 ENCOUNTER — Ambulatory Visit: Payer: No Typology Code available for payment source | Admitting: Family Medicine

## 2017-01-02 ENCOUNTER — Other Ambulatory Visit (HOSPITAL_COMMUNITY)
Admission: RE | Admit: 2017-01-02 | Discharge: 2017-01-02 | Disposition: A | Discharge status: Home / Self Care | Payer: No Typology Code available for payment source | Source: Ambulatory Visit | Attending: Family Medicine | Admitting: Family Medicine

## 2017-01-02 ENCOUNTER — Ambulatory Visit: Payer: No Typology Code available for payment source | Attending: Family Medicine | Admitting: Family Medicine

## 2017-01-02 ENCOUNTER — Encounter: Payer: Self-pay | Admitting: Family Medicine

## 2017-01-02 VITALS — BP 126/85 | HR 82 | Temp 99.5°F | Resp 18 | Ht 65.0 in | Wt 135.2 lb

## 2017-01-02 DIAGNOSIS — L298 Other pruritus: Secondary | ICD-10-CM | POA: Insufficient documentation

## 2017-01-02 DIAGNOSIS — Z3041 Encounter for surveillance of contraceptive pills: Secondary | ICD-10-CM | POA: Diagnosis not present

## 2017-01-02 DIAGNOSIS — Z8742 Personal history of other diseases of the female genital tract: Secondary | ICD-10-CM | POA: Insufficient documentation

## 2017-01-02 DIAGNOSIS — Z793 Long term (current) use of hormonal contraceptives: Secondary | ICD-10-CM | POA: Diagnosis not present

## 2017-01-02 DIAGNOSIS — R102 Pelvic and perineal pain: Secondary | ICD-10-CM | POA: Diagnosis not present

## 2017-01-02 DIAGNOSIS — I10 Essential (primary) hypertension: Secondary | ICD-10-CM | POA: Insufficient documentation

## 2017-01-02 DIAGNOSIS — Z79899 Other long term (current) drug therapy: Secondary | ICD-10-CM | POA: Insufficient documentation

## 2017-01-02 DIAGNOSIS — N898 Other specified noninflammatory disorders of vagina: Secondary | ICD-10-CM | POA: Insufficient documentation

## 2017-01-02 LAB — POCT URINALYSIS DIPSTICK
Bilirubin, UA: NEGATIVE
GLUCOSE UA: NEGATIVE
KETONES UA: NEGATIVE
Leukocytes, UA: NEGATIVE
Nitrite, UA: NEGATIVE
PROTEIN UA: NEGATIVE
SPEC GRAV UA: 1.015 (ref 1.010–1.025)
UROBILINOGEN UA: 1 U/dL
pH, UA: 6.5 (ref 5.0–8.0)

## 2017-01-02 LAB — POCT URINE PREGNANCY: PREG TEST UR: NEGATIVE

## 2017-01-02 MED ORDER — HYDROCHLOROTHIAZIDE 25 MG PO TABS: 25.0000 mg | ORAL_TABLET | Freq: Every day | ORAL | 3 refills | Status: DC

## 2017-01-02 MED ORDER — FLUCONAZOLE 150 MG PO TABS: 150.0000 mg | ORAL_TABLET | Freq: Once | ORAL | 0 refills | Status: AC

## 2017-01-02 MED ORDER — NORGESTIMATE-ETH ESTRADIOL 0.25-35 MG-MCG PO TABS: 1.0000 | ORAL_TABLET | Freq: Every day | ORAL | 11 refills | Status: DC

## 2017-01-02 NOTE — Progress Notes (Signed)
JAJPatient is here for HTN   Patient complains yeast infection itching no vaginal discharge & no bad odor

## 2017-01-02 NOTE — Patient Instructions (Signed)

## 2017-01-02 NOTE — Progress Notes (Signed)
Subjective:  Patient ID: Rhonda Kennedy, female    DOB: 03/08/1985  Age: 31 y.o. MRN: [id number]  CC: Establish Care   HPI Rhonda Kennedy presents to reestablish care.  Vaginal complaint: Onset 1 week ago.  She reports white thick vaginal discharge small amount last week but denies any currently.  She denies any vaginal lesions or dysuria.  She requesting OCP refill. LPM was about 1 week ago. She denies any history of bleeding or clotting disorder, chronic migraine, or dyspnea. History of hypertension.  She is not exercising and is adherent to low salt diet.  She does not check blood pressure at home . Cardiac symptoms none. Patient denies chest pain, chest pressure/discomfort, dyspnea, lower extremity edema, near-syncope, palpitations and syncope.  Cardiovascular risk factors: hypertension, sedentary lifestyle and smoking/ tobacco exposure. Use of agents associated with hypertension: none. History of target organ damage: none.    Outpatient Medications Prior to Visit  Medication Sig Dispense Refill  . albuterol (PROVENTIL HFA;VENTOLIN HFA) 108 (90 BASE) MCG/ACT inhaler Inhale 2 puffs into the lungs every 4 (four) hours as needed for wheezing. 1 Inhaler 0  . fexofenadine (ALLEGRA) 60 MG tablet Take 1 tablet (60 mg total) by mouth 2 (two) times daily. 30 tablet 1  . ketoconazole (NIZORAL) 2 % cream Apply 1 application topically 2 (two) times daily. 60 g 0  . metroNIDAZOLE (FLAGYL) 500 MG tablet Take 1 tablet (500 mg total) by mouth 2 (two) times daily. 14 tablet 0  . hydrochlorothiazide (HYDRODIURIL) 25 MG tablet Take 1 tablet (25 mg total) by mouth daily. 30 tablet 11  . norgestimate-ethinyl estradiol (ORTHO-CYCLEN,SPRINTEC,PREVIFEM) 0.25-35 MG-MCG tablet Take 1 tablet by mouth daily. 1 Package 11   No facility-administered medications prior to visit.     ROS Review of Systems  Constitutional: Negative.   Respiratory: Negative.   Cardiovascular: Negative.   Gastrointestinal:  Negative.   Genitourinary: Positive for vaginal discharge (history). Negative for dysuria and genital sores.       Vaginal itching   Objective:  BP 126/85 (BP Location: Left Arm, Patient Position: Sitting, Cuff Size: Normal)   Pulse 82   Temp 99.5 F (37.5 C) (Oral)   Resp 18   Ht 5\' 5"  (1.651 m)   Wt 135 lb 3.2 oz (61.3 kg)   SpO2 98%   BMI 22.50 kg/m   BP/Weight 01/02/2017 09/19/2016 09/13/2016  Systolic BP 126 137 153  Diastolic BP 85 97 94  Wt. (Lbs) 135.2 - 137.2  BMI 22.5 - 25.09     Physical Exam  Constitutional: She appears well-developed and well-nourished.  Cardiovascular: Normal rate, regular rhythm, normal heart sounds and intact distal pulses.  Pulmonary/Chest: Effort normal and breath sounds normal.  Abdominal: Soft. Bowel sounds are normal. There is tenderness (pelvic).  Genitourinary:  Genitourinary Comments: Urine cyto  Skin: Skin is warm and dry.  Psychiatric: She has a normal mood and affect.  Nursing note and vitals reviewed.  Assessment & Plan:   1. Essential hypertension  - hydrochlorothiazide (HYDRODIURIL) 25 MG tablet; Take 1 tablet (25 mg total) daily by mouth.  Dispense: 90 tablet; Refill: 3  2. Vaginal itching  - POCT urinalysis dipstick - Urine cytology ancillary only - Urine cytology ancillary only  3. History of vaginal discharge  - Urine cytology ancillary only - fluconazole (DIFLUCAN) 150 MG tablet; Take 1 tablet (150 mg total) once for 1 dose by mouth.  Dispense: 1 tablet; Refill: 0 - Urine cytology ancillary only  4.  Pelvic pain in female  - POCT urinalysis dipstick - Urine cytology ancillary only - POCT urine pregnancy  5. Encounter for birth control pills maintenance  - norgestimate-ethinyl estradiol (ORTHO-CYCLEN,SPRINTEC,PREVIFEM) 0.25-35 MG-MCG tablet; Take 1 tablet daily by mouth.  Dispense: 1 Package; Refill: 11     Follow-up: Return in about 6 months (around 07/02/2017), or if symptoms worsen or fail to improve,  for HTN.   Lizbeth BarkMandesia R Oletta Buehring FNP

## 2017-01-03 LAB — URINE CYTOLOGY ANCILLARY ONLY
CHLAMYDIA, DNA PROBE: NEGATIVE
Neisseria Gonorrhea: NEGATIVE
Trichomonas: NEGATIVE

## 2017-01-07 ENCOUNTER — Telehealth: Payer: Self-pay

## 2017-01-07 LAB — URINE CYTOLOGY ANCILLARY ONLY
BACTERIAL VAGINITIS: NEGATIVE
CANDIDA VAGINITIS: NEGATIVE

## 2017-01-07 NOTE — Telephone Encounter (Signed)
CMA call regarding lab results   Patient Verify DOB   Patient was aware and understood  

## 2017-01-07 NOTE — Telephone Encounter (Signed)
-----   Message from Lizbeth BarkMandesia R Hairston, FNP sent at 01/04/2017  9:57 AM EST ----- Gonorrhea, Chlamydia and Trichomonas were all negative. #Still awaiting BV/Yeast results#

## 2017-01-23 ENCOUNTER — Emergency Department (HOSPITAL_COMMUNITY)
Admission: EM | Admit: 2017-01-23 | Discharge: 2017-01-23 | Disposition: A | Discharge status: Home / Self Care | Payer: No Typology Code available for payment source | Attending: Emergency Medicine | Admitting: Emergency Medicine

## 2017-01-23 ENCOUNTER — Encounter (HOSPITAL_COMMUNITY): Payer: Self-pay

## 2017-01-23 ENCOUNTER — Other Ambulatory Visit: Payer: Self-pay

## 2017-01-23 DIAGNOSIS — J45909 Unspecified asthma, uncomplicated: Secondary | ICD-10-CM | POA: Insufficient documentation

## 2017-01-23 DIAGNOSIS — Z803 Family history of malignant neoplasm of breast: Secondary | ICD-10-CM | POA: Insufficient documentation

## 2017-01-23 DIAGNOSIS — Z79899 Other long term (current) drug therapy: Secondary | ICD-10-CM | POA: Diagnosis not present

## 2017-01-23 DIAGNOSIS — Z87891 Personal history of nicotine dependence: Secondary | ICD-10-CM | POA: Diagnosis not present

## 2017-01-23 DIAGNOSIS — R569 Unspecified convulsions: Secondary | ICD-10-CM | POA: Diagnosis present

## 2017-01-23 DIAGNOSIS — I10 Essential (primary) hypertension: Secondary | ICD-10-CM | POA: Insufficient documentation

## 2017-01-23 LAB — COMPREHENSIVE METABOLIC PANEL
ALK PHOS: 43 U/L (ref 38–126)
ALT: 12 U/L — AB (ref 14–54)
ANION GAP: 7 (ref 5–15)
AST: 22 U/L (ref 15–41)
Albumin: 3.7 g/dL (ref 3.5–5.0)
BILIRUBIN TOTAL: 0.6 mg/dL (ref 0.3–1.2)
BUN: 7 mg/dL (ref 6–20)
CALCIUM: 9.3 mg/dL (ref 8.9–10.3)
CO2: 29 mmol/L (ref 22–32)
CREATININE: 0.79 mg/dL (ref 0.44–1.00)
Chloride: 98 mmol/L — ABNORMAL LOW (ref 101–111)
Glucose, Bld: 100 mg/dL — ABNORMAL HIGH (ref 65–99)
Potassium: 3.3 mmol/L — ABNORMAL LOW (ref 3.5–5.1)
Sodium: 134 mmol/L — ABNORMAL LOW (ref 135–145)
TOTAL PROTEIN: 7.2 g/dL (ref 6.5–8.1)

## 2017-01-23 LAB — CBC WITH DIFFERENTIAL/PLATELET
Basophils Absolute: 0 10*3/uL (ref 0.0–0.1)
Basophils Relative: 0 %
Eosinophils Absolute: 0 10*3/uL (ref 0.0–0.7)
Eosinophils Relative: 0 %
HEMATOCRIT: 40.5 % (ref 36.0–46.0)
HEMOGLOBIN: 13.8 g/dL (ref 12.0–15.0)
LYMPHS ABS: 1.9 10*3/uL (ref 0.7–4.0)
LYMPHS PCT: 20 %
MCH: 30.1 pg (ref 26.0–34.0)
MCHC: 34.1 g/dL (ref 30.0–36.0)
MCV: 88.2 fL (ref 78.0–100.0)
MONOS PCT: 9 %
Monocytes Absolute: 0.9 10*3/uL (ref 0.1–1.0)
NEUTROS PCT: 71 %
Neutro Abs: 6.6 10*3/uL (ref 1.7–7.7)
Platelets: 320 10*3/uL (ref 150–400)
RBC: 4.59 MIL/uL (ref 3.87–5.11)
RDW: 12.3 % (ref 11.5–15.5)
WBC: 9.4 10*3/uL (ref 4.0–10.5)

## 2017-01-23 MED ORDER — LEVETIRACETAM 500 MG/5ML IV SOLN
1000.0000 mg | Freq: Once | INTRAVENOUS | Status: AC
  Administered 2017-01-23: 1000 mg via INTRAVENOUS
  Filled 2017-01-23: qty 10

## 2017-01-23 MED ORDER — LEVETIRACETAM ER 500 MG PO TB24: 500.0000 mg | ORAL_TABLET | Freq: Every day | ORAL | 1 refills | Status: DC

## 2017-01-23 MED ORDER — OXYCODONE-ACETAMINOPHEN 5-325 MG PO TABS
1.0000 | ORAL_TABLET | Freq: Once | ORAL | Status: AC
  Administered 2017-01-23: 1 via ORAL
  Filled 2017-01-23: qty 1

## 2017-01-23 NOTE — ED Provider Notes (Signed)
[facility] Provider Note   CSN: [id number] Arrival date & time: 01/23/17  0909     History   Chief Complaint Chief Complaint  Patient presents with  . Seizures    HPI Rhonda Kennedy PaymentC Rhonda Kennedy is a 31 y.o. female.  Patient states that she had a seizure last night.  She feels back to her normal.  Her significant other stated that she was confused for a little while after the seizure but is back to normal now.  Patient has a history of seizures but has been off medicine for 2 years and has not had a seizure till now.   The history is provided by the patient.  Seizures   This is a recurrent problem. The current episode started 3 to 5 hours ago. The problem has been resolved. There was 1 seizure. Pertinent negatives include no sleepiness, no headaches, no chest pain, no cough and no diarrhea. Characteristics do not include eye blinking. The seizure(s) had no focality. Possible causes do not include medication or dosage change. There has been no fever.    Past Medical History:  Diagnosis Date  . Asthma    Diagnosed as child  . Hemorrhoids   . History of bronchitis   . Hypertension    2012  . Seizures (HCC)    Onset 31 yo    Patient Active Problem List   Diagnosis Date Noted  . Routine screening for STI (sexually transmitted infection) 09/13/2016  . Family history of diabetes mellitus in maternal grandmother 01/12/2015  . Essential hypertension, benign 10/23/2013  . Smoking 10/23/2013  . Seizure disorder (HCC) 11/14/2011  . Asthma 11/14/2011  . Anemia 08/23/2010  . Family history of breast cancer 08/23/2010    Past Surgical History:  Procedure Laterality Date  . birth control  07/2007   Mirena 100    OB History    Gravida Para Term Preterm AB Living   7 2 2  0 3 2   SAB TAB Ectopic Multiple Live Births   0 3 0 0 2       Home Medications    Prior to Admission medications   Medication Sig Start Date End Date Taking?  Authorizing Provider  albuterol (PROVENTIL HFA;VENTOLIN HFA) 108 (90 BASE) MCG/ACT inhaler Inhale 2 puffs into the lungs every 4 (four) hours as needed for wheezing. 07/27/12   Baker, Adrian BlackwaterZachary H, PA-C  fexofenadine (ALLEGRA) 60 MG tablet Take 1 tablet (60 mg total) by mouth 2 (two) times daily. 06/26/13   Hayden RasmussenMabe, David, NP  hydrochlorothiazide (HYDRODIURIL) 25 MG tablet Take 1 tablet (25 mg total) daily by mouth. 01/02/17   Lizbeth BarkHairston, Mandesia R, FNP  ketoconazole (NIZORAL) 2 % cream Apply 1 application topically 2 (two) times daily. 09/13/16   Funches, Gerilyn NestleJosalyn, MD  levETIRAcetam (KEPPRA XR) 500 MG 24 hr tablet Take 1 tablet (500 mg total) by mouth daily. 01/23/17   Bethann BerkshireZammit, Marissah Vandemark, MD  metroNIDAZOLE (FLAGYL) 500 MG tablet Take 1 tablet (500 mg total) by mouth 2 (two) times daily. 09/14/16   Funches, Gerilyn NestleJosalyn, MD  norgestimate-ethinyl estradiol (ORTHO-CYCLEN,SPRINTEC,PREVIFEM) 0.25-35 MG-MCG tablet Take 1 tablet daily by mouth. 01/02/17   Lizbeth BarkHairston, Mandesia R, FNP    Family History Family History  Problem Relation Age of Onset  . Hypertension Mother   . Hyperlipidemia Mother   . Hypertension Sister   . Other Sister        anxiety  . Cancer Maternal Grandmother     Social History Social History  Tobacco Use  . Smoking status: Former Smoker    Packs/day: 0.25    Years: 10.00    Pack years: 2.50    Types: Cigarettes    Last attempt to quit: 12/23/2016    Years since quitting: 0.0  . Smokeless tobacco: Never Used  Substance Use Topics  . Alcohol use: Yes    Alcohol/week: 1.8 oz    Types: 3 Standard drinks or equivalent per week    Comment: per week  . Drug use: Yes    Types: Marijuana    Comment: marijuana last used 01/23/17     Allergies   Patient has no known allergies.   Review of Systems Review of Systems  Constitutional: Negative for appetite change and fatigue.  HENT: Negative for congestion, ear discharge and sinus pressure.   Eyes: Negative for discharge.  Respiratory:  Negative for cough.   Cardiovascular: Negative for chest pain.  Gastrointestinal: Negative for abdominal pain and diarrhea.  Genitourinary: Negative for frequency and hematuria.  Musculoskeletal: Negative for back pain.  Skin: Negative for rash.  Neurological: Positive for seizures. Negative for headaches.  Psychiatric/Behavioral: Negative for hallucinations.     Physical Exam Updated Vital Signs BP 117/83   Pulse 70   Temp 98.3 F (36.8 C) (Oral)   Resp 14   Ht 5\' 4"  (1.626 m)   Wt 59 kg (130 lb)   SpO2 98%   BMI 22.31 kg/m   Physical Exam  Constitutional: She is oriented to person, place, and time. She appears well-developed.  HENT:  Head: Normocephalic.  Eyes: Conjunctivae and EOM are normal. No scleral icterus.  Neck: Neck supple. No thyromegaly present.  Cardiovascular: Normal rate and regular rhythm. Exam reveals no gallop and no friction rub.  No murmur heard. Pulmonary/Chest: No stridor. She has no wheezes. She has no rales. She exhibits no tenderness.  Abdominal: She exhibits no distension. There is no tenderness. There is no rebound.  Musculoskeletal: Normal range of motion. She exhibits no edema.  Lymphadenopathy:    She has no cervical adenopathy.  Neurological: She is oriented to person, place, and time. She exhibits normal muscle tone. Coordination normal.  Skin: No rash noted. No erythema.  Psychiatric: She has a normal mood and affect. Her behavior is normal.     ED Treatments / Results  Labs (all labs ordered are listed, but only abnormal results are displayed) Labs Reviewed  COMPREHENSIVE METABOLIC PANEL - Abnormal; Notable for the following components:      Result Value   Sodium 134 (*)    Potassium 3.3 (*)    Chloride 98 (*)    Glucose, Bld 100 (*)    ALT 12 (*)    All other components within normal limits  CBC WITH DIFFERENTIAL/PLATELET    EKG  EKG Interpretation None       Radiology No results found.  Procedures Procedures  (including critical care time)  Medications Ordered in ED Medications  levETIRAcetam (KEPPRA) 1,000 mg in sodium chloride 0.9 % 100 mL IVPB (1,000 mg Intravenous New Bag/Given 01/23/17 1059)  oxyCODONE-acetaminophen (PERCOCET/ROXICET) 5-325 MG per tablet 1 tablet (1 tablet Oral Given 01/23/17 1049)     Initial Impression / Assessment and Plan / ED Course  I have reviewed the triage vital signs and the nursing notes.  Pertinent labs & imaging results that were available during my care of the patient were reviewed by me and considered in my medical decision making (see chart for details).  Labs unremarkable.  Patient with recurrent seizures.  She will be placed back on Keppra and will follow up with [facility] to see if she needs to continue treatment or further evaluation  Final Clinical Impressions(s) / ED Diagnoses   Final diagnoses:  Seizure [facility])    ED Discharge Orders        Ordered    levETIRAcetam (KEPPRA XR) 500 MG 24 hr tablet  Daily     01/23/17 1141       Bethann Berkshire, MD 01/23/17 1147

## 2017-01-23 NOTE — Discharge Instructions (Signed)
[facility] next 2-3 weeks.

## 2017-01-23 NOTE — ED Triage Notes (Addendum)
Pt arrives POV with c/o seizure at 0510 this AM while lying in bed. Boyfriend describes. Stiffning and non responsive ness lasting about a minute and non responsive after. Pt was unaware of seizure activity after same but c/o pain at lip and tongue. C/o headache.  Alert and oriented x 4 ambulates to bed. HX of seizure with treatment by Keppra but no longer taking same.

## 2017-01-23 NOTE — ED Notes (Signed)
Pt states she understands instructions. Home stable with steady gait via wheel chair. Pt is unable to sign because of computer not working.

## 2017-03-04 NOTE — Progress Notes (Signed)
[facility]  PATIENT: Rhonda Kennedy DOB: 25-Feb-1986   REASON FOR VISIT: Hospital follow-up visit for seizures  HISTORY FROM: Patient    HISTORY OF PRESENT ILLNESS:01/23/17 FROM RECORDCrystal C Kennedy is a 32 y.o. female.  Patient states that she had a seizure last night.  She feels back to her normal.  Her significant other stated that she was confused for a little while after the seizure but is back to normal now.  Patient has a history of seizures but has been off medicine for 2 years and has not had a seizure till now. Interval history 1/8/2019CM Rhonda Kennedy, 32 year old female returns for follow-up with a history of seizure disorder.  She was last seen in this office November 2016.  She was seen in the emergency room on November 28 after having a seizure in her sleep.  She had been off of her seizure medication for over 2 years.  She was loaded with Keppra and told to return to [place].  MRI and EEG in the past have been normal.  She also says she 'does not to want to continue to be seen at this office".  She says " I was seen once a year and had my medications refilled, but no one  found out why I have a seizure disorder".  She denies further seizure activity.  She has a history of  hypertension and takes HydroDIURIL.  Reviewed hospital record.  She returns for reevaluation  01/05/15 CMMs. [name] is a 32 year old female with a history of seizures. She returns today for follow-up. She was last seen by Butch Penny, NP 12/09/13. She reports that she has not had any seizures since her last visit. She is currently taking Keppra and tolerating it well. She is able to operate a motor vehicle without difficulty. She is currently taking a leave from  School. She is working full time. She states that she is sleeping well at night. She also has a history of  HTN and is being treated with Hydrochlorothiazide. She is trying to quit smoking No new neurological complaints.   HISTORY  (CM); 32 year old right-handed black female with a history of seizures. She was last seen 12/12/11. Last seizure activity occurred in January 2013 after missing doses of Keppra. This occurred in her sleep. The patient had a MRI of the brain that was unremarkable, and has had an EEG study that was also normal. The patient does report some drowsiness, and she admits to not getting enough sleep The patient has not had any further seizures. He is currently not working but is in school for a business degree. No new neurologic complaints     REVIEW OF SYSTEMS: Full 14 system review of systems performed and notable only for those listed, all others are neg:  Constitutional: Fatigue Cardiovascular: neg Ear/Nose/Throat: neg  Skin: neg Eyes: neg Respiratory: neg Gastroitestinal: neg  Genitourinary urgency Hematology/Lymphatic: neg  Endocrine: neg Musculoskeletal:neg Allergy/Immunology: neg Neurological: Seizure disorder Psychiatric: Depression anxiety Sleep : neg   ALLERGIES: No Known Allergies  HOME MEDICATIONS: Outpatient Medications Prior to Visit  Medication Sig Dispense Refill  . hydrochlorothiazide (HYDRODIURIL) 25 MG tablet Take 1 tablet (25 mg total) daily by mouth. 90 tablet 3  . levETIRAcetam (KEPPRA XR) 500 MG 24 hr tablet Take 1 tablet (500 mg total) by mouth daily. 30 tablet 1  . norgestimate-ethinyl estradiol (ORTHO-CYCLEN,SPRINTEC,PREVIFEM) 0.25-35 MG-MCG tablet Take 1 tablet daily by mouth. 1 Package 11  . acetaminophen (TYLENOL) 325 MG tablet  Take 650 mg by mouth every 6 (six) hours as needed for mild pain.    Marland Kitchen. ketoconazole (NIZORAL) 2 % cream Apply 1 application topically 2 (two) times daily. (Patient not taking: Reported on 01/23/2017) 60 g 0  . albuterol (PROVENTIL HFA;VENTOLIN HFA) 108 (90 BASE) MCG/ACT inhaler Inhale 2 puffs into the lungs every 4 (four) hours as needed for wheezing. 1 Inhaler 0  . fexofenadine (ALLEGRA) 60 MG tablet Take 1 tablet (60 mg total) by  mouth 2 (two) times daily. (Patient not taking: Reported on 01/23/2017) 30 tablet 1  . metroNIDAZOLE (FLAGYL) 500 MG tablet Take 1 tablet (500 mg total) by mouth 2 (two) times daily. (Patient not taking: Reported on 01/23/2017) 14 tablet 0   No facility-administered medications prior to visit.     PAST MEDICAL HISTORY: Past Medical History:  Diagnosis Date  . Asthma    Diagnosed as child  . Hemorrhoids   . History of bronchitis   . Hypertension    2012  . Seizures (HCC)    Onset 32 yo, most recent Dec 2018    PAST SURGICAL HISTORY: Past Surgical History:  Procedure Laterality Date  . birth control  07/2007   Mirena 100    FAMILY HISTORY: Family History  Problem Relation Age of Onset  . Hypertension Mother   . Hyperlipidemia Mother   . Hypertension Sister   . Other Sister        anxiety  . Cancer Maternal Grandmother     SOCIAL HISTORY: Social History   Socioeconomic History  . Marital status: Single    Spouse name: Not on file  . Number of children: Not on file  . Years of education: Not on file  . Highest education level: Not on file  Social Needs  . Financial resource strain: Not on file  . Food insecurity - worry: Not on file  . Food insecurity - inability: Not on file  . Transportation needs - medical: Not on file  . Transportation needs - non-medical: Not on file  Occupational History  . Not on file  Tobacco Use  . Smoking status: Former Smoker    Packs/day: 0.25    Years: 10.00    Pack years: 2.50    Types: Cigarettes    Last attempt to quit: 12/23/2016    Years since quitting: 0.1  . Smokeless tobacco: Never Used  Substance and Sexual Activity  . Alcohol use: Yes    Alcohol/week: 1.8 oz    Types: 3 Standard drinks or equivalent per week    Comment: per week  . Drug use: Yes    Types: Marijuana    Comment: marijuana last used 01/23/17  . Sexual activity: Not on file  Other Topics Concern  . Not on file  Social History Narrative  . Not  on file     PHYSICAL EXAM  Vitals:   03/05/17 1432  BP: (!) 134/95  Pulse: 69  Weight: 130 lb 6.4 oz (59.1 kg)   Body mass index is 22.38 kg/m.  Generalized: Well developed, in no acute distress  Head: normocephalic and atraumatic,. Oropharynx benign  Neck: Supple,   Musculoskeletal: No deformity   Neurological examination   Mentation: Alert oriented to time, place, history taking. Attention span and concentration appropriate. Recent and remote memory intact.  Follows all commands speech and language fluent.  No eye contact.  Short answers  Cranial nerve II-XII: .Pupils were equal round reactive to light extraocular movements were full, visual field  were full on confrontational test. Facial sensation and strength were normal. hearing was intact to finger rubbing bilaterally. Uvula tongue midline. head turning and shoulder shrug were normal and symmetric.Tongue protrusion into cheek strength was normal. Motor: normal bulk and tone, full strength in the BUE, BLE,  Sensory: normal and symmetric to light touch, pinprick, and  Vibration, in the upper and lower extremities Coordination: finger-nose-finger, heel-to-shin bilaterally, no dysmetria Reflexes: Symmetric upper and lower plantar responses were flexor bilaterally. Gait and Station: Rising up from seated position without assistance, normal stance,  moderate stride, good arm swing, smooth turning, able to perform tiptoe, and heel walking without difficulty. Tandem gait is steady  DIAGNOSTIC DATA (LABS, IMAGING, TESTING) - I reviewed patient records, labs, notes, testing and imaging myself where available.  Lab Results  Component Value Date   WBC 9.4 01/23/2017   HGB 13.8 01/23/2017   HCT 40.5 01/23/2017   MCV 88.2 01/23/2017   PLT 320 01/23/2017      Component Value Date/Time   NA 134 (L) 01/23/2017 0953   NA 141 09/13/2016 0944   K 3.3 (L) 01/23/2017 0953   CL 98 (L) 01/23/2017 0953   CO2 29 01/23/2017 0953   GLUCOSE  100 (H) 01/23/2017 0953   BUN 7 01/23/2017 0953   BUN 7 09/13/2016 0944   CREATININE 0.79 01/23/2017 0953   CREATININE 0.79 01/12/2015 1158   CALCIUM 9.3 01/23/2017 0953   PROT 7.2 01/23/2017 0953   ALBUMIN 3.7 01/23/2017 0953   AST 22 01/23/2017 0953   ALT 12 (L) 01/23/2017 0953   ALKPHOS 43 01/23/2017 0953   BILITOT 0.6 01/23/2017 0953   GFRNONAA >60 01/23/2017 0953   GFRNONAA >89 10/23/2013 1207   GFRAA >60 01/23/2017 0953   GFRAA >89 10/23/2013 1207       ASSESSMENT AND PLAN  32 y.o. year old female  has a past medical history of Asthma, Hemorrhoids, History of bronchitis, Hypertension, and Seizures (HCC). here to follow-up for recent seizure event.  She had been off of her Keppra for over 2 years and did not follow-up in this office.  Patient tells me today she would like to be referred to a different neurologic practice.  PLAN: Continue Keppra 500 mg extended release daily will refill for 3 months until she can be seen at another neurologic practice. She was given names of practices in the area. PCP would have to refer to Dr. Karel Jarvis [phone] at [facility] No follow-up planned Nilda Riggs, [facility], [facility], APRN  [facility] [address], [address] [place], [place] [address] [phone]

## 2017-03-05 ENCOUNTER — Ambulatory Visit (INDEPENDENT_AMBULATORY_CARE_PROVIDER_SITE_OTHER): Payer: No Typology Code available for payment source | Admitting: Nurse Practitioner

## 2017-03-05 ENCOUNTER — Encounter: Payer: Self-pay | Admitting: Nurse Practitioner

## 2017-03-05 VITALS — BP 134/95 | HR 69 | Wt 130.4 lb

## 2017-03-05 DIAGNOSIS — G40309 Generalized idiopathic epilepsy and epileptic syndromes, not intractable, without status epilepticus: Secondary | ICD-10-CM | POA: Diagnosis not present

## 2017-03-05 DIAGNOSIS — G40909 Epilepsy, unspecified, not intractable, without status epilepticus: Secondary | ICD-10-CM

## 2017-03-05 MED ORDER — LEVETIRACETAM ER 500 MG PO TB24: 500.0000 mg | ORAL_TABLET | Freq: Every day | ORAL | 3 refills | Status: DC

## 2017-03-05 NOTE — Patient Instructions (Signed)
Continue Keppra 500 mg extended release dailywill refill for 3 months PCP would have to refer to Dr. Karel JarvisAquino [phone] [facility]

## 2017-03-06 NOTE — Progress Notes (Addendum)
Keppra refill Rx [facility]ed [facility] [address].  03/06/17 Per Enid Skeens Martin, NP, this RN faxed 03/05/17 office note to patient's stated PCP, Arrie SenateMandesia Hairston FNP with a request that Larose KellsM Hairston, FNP refer patient to another neurologic office.

## 2017-07-31 ENCOUNTER — Encounter

## 2017-11-07 ENCOUNTER — Emergency Department (HOSPITAL_COMMUNITY)
Admission: EM | Admit: 2017-11-07 | Discharge: 2017-11-08 | Disposition: A | Discharge status: Home / Self Care | Payer: Medicaid Other | Attending: Emergency Medicine | Admitting: Emergency Medicine

## 2017-11-07 ENCOUNTER — Emergency Department (HOSPITAL_COMMUNITY): Payer: Medicaid Other

## 2017-11-07 ENCOUNTER — Other Ambulatory Visit: Payer: Self-pay

## 2017-11-07 ENCOUNTER — Encounter (HOSPITAL_COMMUNITY): Payer: Self-pay | Admitting: Emergency Medicine

## 2017-11-07 DIAGNOSIS — R1030 Lower abdominal pain, unspecified: Secondary | ICD-10-CM | POA: Diagnosis not present

## 2017-11-07 DIAGNOSIS — Z87891 Personal history of nicotine dependence: Secondary | ICD-10-CM | POA: Diagnosis not present

## 2017-11-07 DIAGNOSIS — R079 Chest pain, unspecified: Secondary | ICD-10-CM | POA: Insufficient documentation

## 2017-11-07 DIAGNOSIS — I10 Essential (primary) hypertension: Secondary | ICD-10-CM | POA: Diagnosis not present

## 2017-11-07 DIAGNOSIS — J45909 Unspecified asthma, uncomplicated: Secondary | ICD-10-CM | POA: Insufficient documentation

## 2017-11-07 DIAGNOSIS — R1010 Upper abdominal pain, unspecified: Secondary | ICD-10-CM

## 2017-11-07 LAB — COMPREHENSIVE METABOLIC PANEL
ALBUMIN: 3.9 g/dL (ref 3.5–5.0)
ALK PHOS: 30 U/L — AB (ref 38–126)
ALT: 10 U/L (ref 0–44)
ANION GAP: 10 (ref 5–15)
AST: 19 U/L (ref 15–41)
BUN: 6 mg/dL (ref 6–20)
CALCIUM: 9.4 mg/dL (ref 8.9–10.3)
CHLORIDE: 101 mmol/L (ref 98–111)
CO2: 25 mmol/L (ref 22–32)
CREATININE: 0.8 mg/dL (ref 0.44–1.00)
GFR calc Af Amer: >60 mL/min (ref >60)
GFR calc non Af Amer: >60 mL/min (ref >60)
GLUCOSE: 116 mg/dL — AB (ref 70–99)
Potassium: 3.1 mmol/L — ABNORMAL LOW (ref 3.5–5.1)
SODIUM: 136 mmol/L (ref 135–145)
Total Bilirubin: 0.8 mg/dL (ref 0.3–1.2)
Total Protein: 6.8 g/dL (ref 6.5–8.1)

## 2017-11-07 LAB — URINALYSIS, ROUTINE W REFLEX MICROSCOPIC
BILIRUBIN URINE: NEGATIVE
GLUCOSE, UA: NEGATIVE mg/dL
HGB URINE DIPSTICK: NEGATIVE
KETONES UR: NEGATIVE mg/dL
Nitrite: NEGATIVE
PH: 7 (ref 5.0–8.0)
PROTEIN: NEGATIVE mg/dL
Specific Gravity, Urine: 1.019 (ref 1.005–1.030)

## 2017-11-07 LAB — CBC
HCT: 42.2 % (ref 36.0–46.0)
HEMOGLOBIN: 13.5 g/dL (ref 12.0–15.0)
MCH: 29.2 pg (ref 26.0–34.0)
MCHC: 32 g/dL (ref 30.0–36.0)
MCV: 91.3 fL (ref 78.0–100.0)
Platelets: 294 10*3/uL (ref 150–400)
RBC: 4.62 MIL/uL (ref 3.87–5.11)
RDW: 12.3 % (ref 11.5–15.5)
WBC: 11.4 10*3/uL — ABNORMAL HIGH (ref 4.0–10.5)

## 2017-11-07 LAB — I-STAT BETA HCG BLOOD, ED (MC, WL, AP ONLY): I-stat hCG, quantitative: <5 m[IU]/mL (ref <5)

## 2017-11-07 LAB — I-STAT TROPONIN, ED: TROPONIN I, POC: 0.02 ng/mL (ref 0.00–0.08)

## 2017-11-07 LAB — LIPASE, BLOOD: Lipase: 40 U/L (ref 11–51)

## 2017-11-07 NOTE — ED Triage Notes (Signed)
Pt presents with multiple complaints. C/o nausea and lower abdominal pain after eating. A headache that resolved after taking advil PTA, HTN hx of same, and non-radiating left sided chest pain.

## 2017-11-07 NOTE — ED Provider Notes (Signed)
[facility] EMERGENCY DEPARTMENT Provider Note   CSN: [id number] Arrival date & time: 11/07/17  1806     History   Chief Complaint Chief Complaint  Patient presents with  . Hypertension  . Abdominal Pain    HPI Rhonda Kennedy is a 32 y.o. female.  The history is provided by the patient and medical records. No language interpreter was used.  Hypertension  Associated symptoms include abdominal pain.  Abdominal Pain       32 year old female with history of hypertension, asthma presenting with multiple complaints.  Patient mention she has had recurrent abdominal pain throughout her life and states that she was told to take Zantac for it.  She felt that the medication does not provide any relief.  She endorsed abdominal pain sometimes brought on with eating.  She now complaining of bloatedness, indigestion sensation, increased burping and pain is waxing and waning minimal at this time.  Furthermore, she also complaining of midsternal chest pain which is sharp and burning sensation.  No fever, lightheadedness, productive cough, hemoptysis.  She does admits to taking Advil ibuprofen on occasion for her discomfort without relief.  She mentioned being evaluated by a GI specialist for this in the past without specific answer.  She endorsed using marijuana on occasion denies alcohol abuse and states she does not normally use tobacco products.  She does not have any significant cardiac history.  Past Medical History:  Diagnosis Date  . Asthma    Diagnosed as child  . Hemorrhoids   . History of bronchitis   . Hypertension    2012  . Seizures (HCC)    Onset 32 yo, most recent Dec 2018    Patient Active Problem List   Diagnosis Date Noted  . Routine screening for STI (sexually transmitted infection) 09/13/2016  . Family history of diabetes mellitus in maternal grandmother 01/12/2015  . Essential hypertension, benign 10/23/2013  . Smoking 10/23/2013  . Seizure  disorder (HCC) 11/14/2011  . Asthma 11/14/2011  . Anemia 08/23/2010  . Family history of breast cancer 08/23/2010    Past Surgical History:  Procedure Laterality Date  . birth control  07/2007   Mirena 100     OB History    Gravida  7   Para  2   Term  2   Preterm  0   AB  3   Living  2     SAB  0   TAB  3   Ectopic  0   Multiple  0   Live Births  2            Home Medications    Prior to Admission medications   Medication Sig Start Date End Date Taking? Authorizing Provider  acetaminophen (TYLENOL) 325 MG tablet Take 650 mg by mouth every 6 (six) hours as needed for mild pain.    [provider]  hydrochlorothiazide (HYDRODIURIL) 25 MG tablet Take 1 tablet (25 mg total) daily by mouth. 01/02/17   Lizbeth Bark, FNP  ketoconazole (NIZORAL) 2 % cream Apply 1 application topically 2 (two) times daily. Patient not taking: Reported on 01/23/2017 09/13/16   Dessa Phi, MD  levETIRAcetam (KEPPRA XR) 500 MG 24 hr tablet Take 1 tablet (500 mg total) by mouth daily. 03/05/17   Nilda Riggs, NP  norgestimate-ethinyl estradiol (ORTHO-CYCLEN,SPRINTEC,PREVIFEM) 0.25-35 MG-MCG tablet Take 1 tablet daily by mouth. 01/02/17   Lizbeth Bark, FNP    Family History Family History  Problem  Relation Age of Onset  . Hypertension Mother   . Hyperlipidemia Mother   . Hypertension Sister   . Other Sister        anxiety  . Cancer Maternal Grandmother     Social History Social History   Tobacco Use  . Smoking status: Former Smoker    Packs/day: 0.25    Years: 10.00    Pack years: 2.50    Types: Cigarettes    Last attempt to quit: 12/23/2016    Years since quitting: 0.8  . Smokeless tobacco: Never Used  Substance Use Topics  . Alcohol use: Yes    Alcohol/week: 3.0 standard drinks    Types: 3 Standard drinks or equivalent per week    Comment: per week  . Drug use: Yes    Types: Marijuana    Comment: marijuana last used 01/23/17       Allergies   Patient has no known allergies.   Review of Systems Review of Systems  Gastrointestinal: Positive for abdominal pain.  All other systems reviewed and are negative.    Physical Exam Updated Vital Signs BP 135/90 (BP Location: Right Arm)   Pulse 60   Temp 98.4 F (36.9 C) (Oral)   Resp 18   LMP 10/05/2017   SpO2 99%   Physical Exam  Constitutional: She appears well-developed and well-nourished. No distress.  Patient is well-appearing in no acute discomfort  HENT:  Head: Atraumatic.  Eyes: Conjunctivae are normal.  Neck: Neck supple.  Cardiovascular: Normal rate and regular rhythm.  Pulmonary/Chest: Effort normal and breath sounds normal. She exhibits tenderness (Mild midsternal chest tenderness on palpation without overlying skin changes.).  Abdominal: Soft. Normal appearance and bowel sounds are normal. There is no tenderness. There is no tenderness at McBurney's point and negative Murphy's sign. No hernia.  Neurological: She is alert.  Skin: No rash noted.  Psychiatric: She has a normal mood and affect.  Nursing note and vitals reviewed.    ED Treatments / Results  Labs (all labs ordered are listed, but only abnormal results are displayed) Labs Reviewed  COMPREHENSIVE METABOLIC PANEL - Abnormal; Notable for the following components:      Result Value   Potassium 3.1 (*)    Glucose, Bld 116 (*)    Alkaline Phosphatase 30 (*)    All other components within normal limits  CBC - Abnormal; Notable for the following components:   WBC 11.4 (*)    All other components within normal limits  URINALYSIS, ROUTINE W REFLEX MICROSCOPIC - Abnormal; Notable for the following components:   Leukocytes, UA TRACE (*)    Bacteria, UA RARE (*)    All other components within normal limits  LIPASE, BLOOD  I-STAT BETA HCG BLOOD, ED ([facility], [facility], [facility] ONLY)  I-STAT TROPONIN, ED    EKG None   Date: 11/08/2017  Rate: 73  Rhythm: normal sinus rhythm with sinus  arrhythmia  QRS Axis: normal  Intervals: normal  ST/T Wave abnormalities: normal  Conduction Disutrbances: none  Narrative Interpretation:   Old EKG Reviewed: No significant changes noted     Radiology Dg Chest 2 View  Result Date: 11/07/2017 CLINICAL DATA:  Patient with abdominal and chest pain. EXAM: CHEST - 2 VIEW COMPARISON:  Chest radiograph 07/27/2012 FINDINGS: Normal cardiac and mediastinal contours. No consolidative pulmonary opacities. No pleural effusion or pneumothorax. Unremarkable osseous structures. IMPRESSION: No acute cardiopulmonary process. Electronically Signed   By: Annia Beltrew  Davis M.D.   On: 11/07/2017 20:39  Procedures Procedures (including critical care time)  Medications Ordered in ED Medications - No data to display   Initial Impression / Assessment and Plan / ED Course  I have reviewed the triage vital signs and the nursing notes.  Pertinent labs & imaging results that were available during my care of the patient were reviewed by me and considered in my medical decision making (see chart for details).     BP (!) 119/101 (BP Location: Right Arm)   Pulse 67   Temp 98.3 F (36.8 C) (Oral)   Resp 16   LMP 10/05/2017   SpO2 100%    Final Clinical Impressions(s) / ED Diagnoses   Final diagnoses:  Chest pain, unspecified type  Abdominal pain, unspecified abdominal location    ED Discharge Orders         Ordered    dicyclomine (BENTYL) 20 MG tablet  2 times daily     11/08/17 0024    omeprazole (PRILOSEC) 20 MG capsule  Daily     11/08/17 0024         12:21 AM Patient with recurrent abdominal pain, postprandial not having chest pain as well.  She is well-appearing in no acute discomfort.  Mild chest wall tenderness on exam.  No tenderness to right upper quadrant to suggest gallbladder etiology.  No guarding or rebound tenderness concerning for perforated ulcer.  Labs are reassuring.  Plan to discharge patient home with Prilosec, and Bentyl.   Encouraged outpatient follow-up with GI specialist.  Return precautions discussed.  Encourage patient to avoid NSAIDs.   Fayrene Helper, PA-C 11/08/17 Burke Keels, MD 11/08/17 [phone]

## 2017-11-07 NOTE — ED Provider Notes (Signed)
MSE was initiated and I personally evaluated the patient  7:33 PM on November 07, 2017.  Patient placed in Quick Look pathway, seen and evaluated   Chief Complaint: Headache. Abdominal pain. Chest pain  HPI:  Patient is a 32 year old female who presents with multiple complaints including headache, chest pain, abdominal pain all fairly constant for past 5 days. Headache is frontal- gradual onset steady progression, improved after advil taken prior to arrival. Chest pain is in left lower chest- not exertional, not pleuritic. Abdominal pain, left upper abdomen and the lower abdomen, worse after eating. Mild associated nausea. Concern with BP being high, reports med compliance.   ROS: - vomiting, diarrhea, dysuria, numbness, weakness, or dyspnea.   Physical Exam:   Gen: No distress  Neuro: Awake and Alert  Skin: Warm    Focused Exam: Heart: RRR. Lungs: CTA. Abdomen: soft, tender in LUQ/epigastrium without peritoneal signs. Neuro: CN III-XII grossly intact. Symmetric grip strength. Negative pronator drift.   Initiation of care has begun. The patient has been counseled on the process, plan, and necessity for staying for the completion/evaluation, and the remainder of the medical screening examination. I discussed with patient  and if present family/friends the importance of alerting staff to any new or worsening symptoms or any other concerns throughout his/her ER visit.      Desmond Lopeetrucelli, Decie Verne R, PA-C 11/07/17 Doristine Devoid1937    Pickering, Nathan, MD 11/08/17 [phone]

## 2017-11-08 MED ORDER — DICYCLOMINE HCL 20 MG PO TABS: 20.0000 mg | ORAL_TABLET | Freq: Two times a day (BID) | ORAL | 0 refills | Status: DC

## 2017-11-08 MED ORDER — OMEPRAZOLE 20 MG PO CPDR: 20.0000 mg | DELAYED_RELEASE_CAPSULE | Freq: Every day | ORAL | 0 refills | Status: DC

## 2017-11-08 NOTE — Discharge Instructions (Addendum)
Your chest and abdominal discomfort is likely due to gastritis.  Please avoid avil or ibuprofen for pain.  Take prilosec daily, and take Bentyl twice daily.  Call and follow up with GI specialist for further care.

## 2017-11-14 ENCOUNTER — Ambulatory Visit (INDEPENDENT_AMBULATORY_CARE_PROVIDER_SITE_OTHER): Payer: Medicaid Other | Admitting: Student

## 2017-11-14 ENCOUNTER — Other Ambulatory Visit (HOSPITAL_COMMUNITY)
Admission: RE | Admit: 2017-11-14 | Discharge: 2017-11-14 | Disposition: A | Discharge status: Home / Self Care | Payer: Medicaid Other | Source: Ambulatory Visit | Attending: Student | Admitting: Student

## 2017-11-14 ENCOUNTER — Encounter: Payer: Self-pay | Admitting: Student

## 2017-11-14 VITALS — BP 133/95 | HR 76 | Wt 123.8 lb

## 2017-11-14 DIAGNOSIS — Z1151 Encounter for screening for human papillomavirus (HPV): Secondary | ICD-10-CM | POA: Diagnosis not present

## 2017-11-14 DIAGNOSIS — Z803 Family history of malignant neoplasm of breast: Secondary | ICD-10-CM | POA: Insufficient documentation

## 2017-11-14 DIAGNOSIS — Z124 Encounter for screening for malignant neoplasm of cervix: Secondary | ICD-10-CM | POA: Diagnosis not present

## 2017-11-14 DIAGNOSIS — Z0000 Encounter for general adult medical examination without abnormal findings: Secondary | ICD-10-CM

## 2017-11-14 MED ORDER — FLUCONAZOLE 150 MG PO TABS: 150.0000 mg | ORAL_TABLET | Freq: Once | ORAL | 1 refills | Status: AC

## 2017-11-14 NOTE — Patient Instructions (Signed)
-  Take diflucan today and then take a refill in three days  Vaginal Yeast infection, Adult Vaginal yeast infection is a condition that causes soreness, swelling, and redness (inflammation) of the vagina. It also causes vaginal discharge. This is a common condition. Some women get this infection frequently. What are the causes? This condition is caused by a change in the normal balance of the yeast (candida) and bacteria that live in the vagina. This change causes an overgrowth of yeast, which causes the inflammation. What increases the risk? This condition is more likely to develop in:  Women who take antibiotic medicines.  Women who have diabetes.  Women who take birth control pills.  Women who are pregnant.  Women who douche often.  Women who have a weak defense (immune) system.  Women who have been taking steroid medicines for a long time.  Women who frequently wear tight clothing.  What are the signs or symptoms? Symptoms of this condition include:  White, thick vaginal discharge.  Swelling, itching, redness, and irritation of the vagina. The lips of the vagina (vulva) may be affected as well.  Pain or a burning feeling while urinating.  Pain during sex.  How is this diagnosed? This condition is diagnosed with a medical history and physical exam. This will include a pelvic exam. Your health care provider will examine a sample of your vaginal discharge under a microscope. Your health care provider may send this sample for testing to confirm the diagnosis. How is this treated? This condition is treated with medicine. Medicines may be over-the-counter or prescription. You may be told to use one or more of the following:  Medicine that is taken orally.  Medicine that is applied as a cream.  Medicine that is inserted directly into the vagina (suppository).  Follow these instructions at home:  Take or apply over-the-counter and prescription medicines only as told by your  health care provider.  Do not have sex until your health care provider has approved. Tell your sex partner that you have a yeast infection. That person should go to his or her health care provider if he or she develops symptoms.  Do not wear tight clothes, such as pantyhose or tight pants.  Avoid using tampons until your health care provider approves.  Eat more yogurt. This may help to keep your yeast infection from returning.  Try taking a sitz bath to help with discomfort. This is a warm water bath that is taken while you are sitting down. The water should only come up to your hips and should cover your buttocks. Do this 3-4 times per day or as told by your health care provider.  Do not douche.  Wear breathable, cotton underwear.  If you have diabetes, keep your blood sugar levels under control. Contact a health care provider if:  You have a fever.  Your symptoms go away and then return.  Your symptoms do not get better with treatment.  Your symptoms get worse.  You have new symptoms.  You develop blisters in or around your vagina.  You have blood coming from your vagina and it is not your menstrual period.  You develop pain in your abdomen. This information is not intended to replace advice given to you by your health care provider. Make sure you discuss any questions you have with your health care provider. Document Released: 11/22/2004 Document Revised: 07/27/2015 Document Reviewed: 08/16/2014 Elsevier Interactive Patient Education  2018 [employer]

## 2017-11-15 ENCOUNTER — Encounter: Payer: Self-pay | Admitting: *Deleted

## 2017-11-15 LAB — CYTOLOGY - PAP
CHLAMYDIA, DNA PROBE: NEGATIVE
DIAGNOSIS: NEGATIVE
HPV: NOT DETECTED
Neisseria Gonorrhea: NEGATIVE

## 2017-11-15 NOTE — Progress Notes (Signed)
History:  Rhonda Kennedy is a 32 y.o. [id number] who presents to clinic today for annual exam. She denies any ob-gyn complaints. She is taking birth control (Sprintec), and she is satisfied with it.   The following portions of the patient's history were reviewed and updated as appropriate: allergies, current medications, family history, past medical history, social history, past surgical history and problem list.  Review of Systems:  Review of Systems  Constitutional: Negative.   HENT: Negative.   Eyes: Negative.   Respiratory: Negative.   Cardiovascular: Negative.   Genitourinary: Negative.   Musculoskeletal: Negative.   Skin: Negative.   Neurological: Negative.       BP 126/76 mmHg  Pulse 97  Temp(Src) 98.5 F (36.9 C)  Wt 112 lb (50.803 kg)  LMP 04/07/2014 CONSTITUTIONAL: Well-developed, well-nourished female in no acute distress.  EYES: EOM intact, conjunctivae normal, no scleral icterus HEAD: Normocephalic, atraumatic ENT: External right and left ear normal, oropharynx is clear and moist. CARDIOVASCULAR: Normal heart rate noted, regular rhythm. No cyanosis or edema. 2+ distal pulses.  RESPIRATORY: Clear to auscultation bilaterally. Effort and breath sounds normal, no problems with respiration noted. GASTROINTESTINAL:Soft, normal bowel sounds, no distention noted.  No tenderness, rebound or guarding.  GENITOURINARY: Normal appearing external genitalia; normal appearing vaginal mucosa and cervix.  Normal appearing discharge.  Pap smear obtained.  Normal uterine size, no other palpable masses, no uterine or adnexal tenderness. Breast symmetric in size. No masses, skin changes, nipple drainage or lymphadenopathy noted.  MUSCULOSKELETAL: Normal range of motion. No tenderness. SKIN: Skin is warm and dry. No rash noted. Not diaphoretic. No erythema. No pallor. NEUROLGIC: Alert and oriented to person, place, and time. Normal reflexes, muscle tone, coordination. No cranial nerve  deficit noted. PSYCHIATRIC: Normal mood and affect. Normal behavior. Normal judgment and thought content. HEM/LYMPH/IMMUNOLOGIC: Neck supple, no masses.  Normal thyroid.    Labs and Imaging No results found for this or any previous visit (from the past 24 hour(s)).  No results found.   Assessment & Plan:  1. Family history of breast cancer -Breast exam benign today 2. Preventative health care  - Cytology - PAP with STI testing.  -Patient does not want testing for HIV and RPR today   Rhonda Kennedy, Rhonda Kennedy, CNM 11/15/2017 11:22 AM

## 2017-11-18 ENCOUNTER — Encounter: Payer: Self-pay | Admitting: *Deleted

## 2017-12-04 ENCOUNTER — Encounter: Payer: Self-pay | Admitting: *Deleted

## 2017-12-05 ENCOUNTER — Other Ambulatory Visit: Payer: Self-pay | Admitting: *Deleted

## 2017-12-05 NOTE — Telephone Encounter (Signed)
Opened in error

## 2017-12-23 ENCOUNTER — Encounter: Payer: Self-pay | Admitting: *Deleted

## 2017-12-26 ENCOUNTER — Other Ambulatory Visit: Payer: Self-pay

## 2017-12-26 ENCOUNTER — Emergency Department (HOSPITAL_COMMUNITY)
Admission: EM | Admit: 2017-12-26 | Discharge: 2017-12-26 | Disposition: A | Discharge status: Home / Self Care | Payer: Medicaid Other | Attending: Emergency Medicine | Admitting: Emergency Medicine

## 2017-12-26 DIAGNOSIS — J45909 Unspecified asthma, uncomplicated: Secondary | ICD-10-CM | POA: Insufficient documentation

## 2017-12-26 DIAGNOSIS — Z79899 Other long term (current) drug therapy: Secondary | ICD-10-CM | POA: Diagnosis not present

## 2017-12-26 DIAGNOSIS — R21 Rash and other nonspecific skin eruption: Secondary | ICD-10-CM

## 2017-12-26 DIAGNOSIS — I10 Essential (primary) hypertension: Secondary | ICD-10-CM | POA: Insufficient documentation

## 2017-12-26 DIAGNOSIS — Z87891 Personal history of nicotine dependence: Secondary | ICD-10-CM | POA: Insufficient documentation

## 2017-12-26 MED ORDER — PREDNISONE 20 MG PO TABS: ORAL_TABLET | ORAL | 0 refills | Status: DC

## 2017-12-26 MED ORDER — DIPHENHYDRAMINE-ZINC ACETATE 2-0.1 % EX CREA: 1.0000 "application " | TOPICAL_CREAM | Freq: Three times a day (TID) | CUTANEOUS | 0 refills | Status: DC | PRN

## 2017-12-26 NOTE — ED Triage Notes (Signed)
[place] the [place] and felt she was bitten by gnats. Spreading rash to both arms that is itching and not responding to benadryl at home.

## 2017-12-26 NOTE — ED Provider Notes (Signed)
[facility] EMERGENCY DEPARTMENT Provider Note   CSN: [id number] Arrival date & time: 12/26/17  0009     History   Chief Complaint Chief Complaint  Patient presents with  . Rash    HPI Rhonda Kennedy is a 32 y.o. female.  The history is provided by the patient. No language interpreter was used.  Rash       32 year old female presenting complaining of a rash.  Patient states she went to the zoo about 5 days ago shortly after she noticed an itchy rash that spread throughout her body.  It initially started in her left forearm and now has manifest on her back and her trunk.  It is itchy and mildly tender.  There is no associated headache, fever, throat swelling, chest pain, trouble breathing, or abdominal cramping.  Patient has tried over the counter hydrocortisone cream as well as Benadryl with minimal improvement.  She does not have any known drug allergy.  Past Medical History:  Diagnosis Date  . Asthma    Diagnosed as child  . Hemorrhoids   . History of bronchitis   . Hypertension    2012  . Seizures (HCC)    Onset 32 yo, most recent Dec 2018    Patient Active Problem List   Diagnosis Date Noted  . Routine screening for STI (sexually transmitted infection) 09/13/2016  . Family history of diabetes mellitus in maternal grandmother 01/12/2015  . Essential hypertension, benign 10/23/2013  . Smoking 10/23/2013  . Seizure disorder (HCC) 11/14/2011  . Asthma 11/14/2011  . Anemia 08/23/2010  . Family history of breast cancer 08/23/2010    Past Surgical History:  Procedure Laterality Date  . birth control  07/2007   Mirena 100     OB History    Gravida  7   Para  2   Term  2   Preterm  0   AB  3   Living  2     SAB  0   TAB  3   Ectopic  0   Multiple  0   Live Births  2            Home Medications    Prior to Admission medications   Medication Sig Start Date End Date Taking? Authorizing Provider  hydrochlorothiazide  (HYDRODIURIL) 25 MG tablet Take 1 tablet (25 mg total) daily by mouth. 01/02/17   Lizbeth Bark, FNP  levETIRAcetam (KEPPRA XR) 500 MG 24 hr tablet Take 1 tablet (500 mg total) by mouth daily. 03/05/17   Nilda Riggs, NP  norgestimate-ethinyl estradiol (ORTHO-CYCLEN,SPRINTEC,PREVIFEM) 0.25-35 MG-MCG tablet Take 1 tablet daily by mouth. 01/02/17   Lizbeth Bark, FNP  omeprazole (PRILOSEC) 20 MG capsule Take 1 capsule (20 mg total) by mouth daily. 11/08/17   Fayrene Helper, PA-C    Family History Family History  Problem Relation Age of Onset  . Hypertension Mother   . Hyperlipidemia Mother   . Hypertension Sister   . Other Sister        anxiety  . Cancer Maternal Grandmother     Social History Social History   Tobacco Use  . Smoking status: Former Smoker    Packs/day: 0.25    Years: 10.00    Pack years: 2.50    Types: Cigarettes    Last attempt to quit: 12/23/2016    Years since quitting: 1.0  . Smokeless tobacco: Never Used  Substance Use Topics  . Alcohol use: Yes  Alcohol/week: 3.0 standard drinks    Types: 3 Standard drinks or equivalent per week    Comment: per week  . Drug use: Yes    Types: Marijuana    Comment: marijuana last used 01/23/17     Allergies   Patient has no known allergies.   Review of Systems Review of Systems  Constitutional: Negative for fever.  Skin: Positive for rash.     Physical Exam Updated Vital Signs BP (!) 146/98 (BP Location: Right Arm)   Pulse 75   Temp 99.4 F (37.4 C) (Oral)   Resp 18   SpO2 100%   Physical Exam  Constitutional: She is oriented to person, place, and time. She appears well-developed and well-nourished. No distress.  HENT:  Head: Atraumatic.  Mouth/Throat: Oropharynx is clear and moist.  Eyes: Conjunctivae are normal.  Neck: Neck supple.  Cardiovascular: Normal rate and regular rhythm.  Pulmonary/Chest: Effort normal and breath sounds normal.  Abdominal: Soft. There is no tenderness.   Neurological: She is alert and oriented to person, place, and time.  Skin: Rash (Fine maculopapular rash noted throughout skin most significant to abdomen, upper back, , And bilateral forearms without any petechial, pustular, or vesicular lesions.) noted.  Psychiatric: She has a normal mood and affect.  Nursing note and vitals reviewed.    ED Treatments / Results  Labs (all labs ordered are listed, but only abnormal results are displayed) Labs Reviewed - No data to display  EKG None  Radiology No results found.  Procedures Procedures (including critical care time)  Medications Ordered in ED Medications - No data to display   Initial Impression / Assessment and Plan / ED Course  I have reviewed the triage vital signs and the nursing notes.  Pertinent labs & imaging results that were available during my care of the patient were reviewed by me and considered in my medical decision making (see chart for details).     BP (!) 146/98 (BP Location: Right Arm)   Pulse 75   Temp 99.4 F (37.4 C) (Oral)   Resp 18   SpO2 100%    Final Clinical Impressions(s) / ED Diagnoses   Final diagnoses:  Rash and nonspecific skin eruption    ED Discharge Orders         Ordered    predniSONE (DELTASONE) 20 MG tablet     12/26/17 0208    diphenhydrAMINE-zinc acetate (BENADRYL EXTRA STRENGTH) cream  3 times daily PRN     12/26/17 0208         2:06 AM Patient here with a nonspecific rash likely an allergic reaction without any systemic manifestation.  No evidence of anaphylaxis.  Rash is not infectious in nature.  She will benefit from a short course of steroid, as well as Benadryl cream.  Return precautions discussed.   Fayrene Helper, PA-C 12/26/17 1610    Dione Booze, MD 12/26/17 [phone]

## 2018-02-04 ENCOUNTER — Ambulatory Visit: Payer: Medicaid Other | Admitting: Family Medicine

## 2019-01-05 ENCOUNTER — Other Ambulatory Visit: Payer: Self-pay

## 2019-01-05 ENCOUNTER — Ambulatory Visit: Payer: Medicaid Other | Admitting: Neurology

## 2019-01-05 DIAGNOSIS — R569 Unspecified convulsions: Secondary | ICD-10-CM

## 2019-01-06 ENCOUNTER — Telehealth: Payer: Self-pay | Admitting: Neurology

## 2019-01-06 NOTE — Procedures (Signed)
    History:  Rhonda Kennedy is a 33 year old patient with a history of a seizure disorder.  The patient has had seizures off and on, she has been noncompliant with medications at times.  The patient returns for an EEG evaluation. This is a routine EEG.  No skull defects are noted.  Medications include hydrochlorothiazide, birth control pills, Allegra, and Proventil.  EEG classification: Normal awake and drowsy  Description of the recording: The background rhythms of this recording consists of a fairly well modulated medium amplitude alpha rhythm of 11 Hz that is reactive to eye opening and closure. As the record progresses, the patient appears to remain in the waking state throughout the recording. Photic stimulation was performed, resulting in a bilateral and symmetric photic driving response. Hyperventilation was also performed, resulting in a minimal buildup of the background rhythm activities without significant slowing seen. Toward the end of the recording, the patient enters the drowsy state with slight symmetric slowing seen. The patient never enters stage II sleep. At no time during the recording does there appear to be evidence of spike or spike wave discharges or evidence of focal slowing. EKG monitor shows no evidence of cardiac rhythm abnormalities with a heart rate of 60.  Impression: This is a normal EEG recording in the waking and drowsy state. No evidence of ictal or interictal discharges are seen.

## 2019-01-06 NOTE — Telephone Encounter (Signed)
I contacted the pt and and advised of results. Pt verbalized understanding and scheduled f/u for 01/08/2019 at 2 check in time of 130.

## 2019-01-06 NOTE — Telephone Encounter (Signed)
I tried to call patient, unable to leave a message, the patient had an EEG study ordered in January 2019, just now done.  Not sure who is following her, no primary care physician listed.  We will need a revisit with this patient.

## 2019-01-08 ENCOUNTER — Encounter: Payer: Self-pay | Admitting: Neurology

## 2019-01-08 ENCOUNTER — Other Ambulatory Visit: Payer: Self-pay

## 2019-01-08 ENCOUNTER — Ambulatory Visit: Payer: Medicaid Other | Admitting: Neurology

## 2019-01-08 VITALS — BP 132/86 | HR 58 | Temp 97.6°F | Ht 64.0 in | Wt 154.4 lb

## 2019-01-08 DIAGNOSIS — G40909 Epilepsy, unspecified, not intractable, without status epilepticus: Secondary | ICD-10-CM

## 2019-01-08 NOTE — Progress Notes (Signed)
Reason for visit: Seizures  Rhonda Kennedy is an 33 y.o. female  History of present illness:  Rhonda Kennedy is a 33 year old right-handed black female with a history of nocturnal seizures.  The patient claims she has not had a seizure since November 2018.  She is getting her seizure medications through her primary care doctor taking one half of a 500 mg Keppra tablet at night.  She is operating a motor vehicle.  She is more careful at this point to take her medications regularly and to keep a better lifestyle, she is getting a better sleep pattern which she was not previously.  The patient returns to this office for an evaluation.  Past Medical History:  Diagnosis Date  . Asthma    Diagnosed as child  . Hemorrhoids   . History of bronchitis   . Hypertension    2012  . Seizures ([place])    Onset 33 yo, most recent Dec 2018    Past Surgical History:  Procedure Laterality Date  . birth control  07/2007   Mirena 100    Family History  Problem Relation Age of Onset  . Hypertension Mother   . Hyperlipidemia Mother   . Hypertension Sister   . Other Sister        anxiety  . Cancer Maternal Grandmother     Social history:  reports that she quit smoking about 2 years ago. Her smoking use included cigarettes. She has a 2.50 pack-year smoking history. She has never used smokeless tobacco. She reports current alcohol use of about 3.0 standard drinks of alcohol per week. She reports current drug use. Drug: Marijuana.   No Known Allergies  Medications:  Prior to Admission medications   Medication Sig Start Date End Date Taking? Authorizing Provider  diphenhydrAMINE-zinc acetate (BENADRYL EXTRA STRENGTH) cream Apply 1 application topically 3 (three) times daily as needed for itching. 12/26/17   Domenic Moras, PA-C  hydrochlorothiazide (HYDRODIURIL) 25 MG tablet Take 1 tablet (25 mg total) daily by mouth. 01/02/17   Alfonse Spruce, FNP  levETIRAcetam (KEPPRA XR) 500 MG 24 hr tablet  Take 1 tablet (500 mg total) by mouth daily. 03/05/17   Dennie Bible, NP  norgestimate-ethinyl estradiol (ORTHO-CYCLEN,SPRINTEC,PREVIFEM) 0.25-35 MG-MCG tablet Take 1 tablet daily by mouth. 01/02/17   Alfonse Spruce, FNP  omeprazole (PRILOSEC) 20 MG capsule Take 1 capsule (20 mg total) by mouth daily. 11/08/17   Domenic Moras, PA-C  predniSONE (DELTASONE) 20 MG tablet 3 tabs po day one, then 2 tabs daily x 4 days 12/26/17   Domenic Moras, PA-C    ROS:  Out of a complete 14 system review of symptoms, the patient complains only of the following symptoms, and all other reviewed systems are negative.  History of seizures  Blood pressure 132/86, pulse (!) 58, temperature 97.6 F (36.4 C), temperature source Temporal, height 5\' 4"  (1.626 m), weight 154 lb 6 oz (70 kg).  Physical Exam  General: The patient is alert and cooperative at the time of the examination.  Skin: No significant peripheral edema is noted.   Neurologic Exam  Mental status: The patient is alert and oriented x 3 at the time of the examination. The patient has apparent normal recent and remote memory, with an apparently normal attention span and concentration ability.   Cranial nerves: Facial symmetry is present. Speech is normal, no aphasia or dysarthria is noted. Extraocular movements are full. Visual fields are full.  Motor: The patient has good  strength in all 4 extremities.  Sensory examination: Soft touch sensation is symmetric on the face, arms, and legs.  Coordination: The patient has good finger-nose-finger and heel-to-shin bilaterally.  Gait and station: The patient has a normal gait. Tandem gait is normal. Romberg is negative. No drift is seen.  Reflexes: Deep tendon reflexes are symmetric.   Assessment/Plan:  1.  History of seizures, nocturnal  The patient will continue on Keppra with a very low dose regimen, 250 mg at night.  She will follow-up here in 1 year, she will call if any further  seizure events are noted.  Marlan Palau MD 01/08/2019 3:13 PM  [facility] [address] [place], [place] [address]  Phone [phone] Fax [phone]

## 2019-10-20 ENCOUNTER — Other Ambulatory Visit: Payer: Medicaid Other

## 2019-10-20 ENCOUNTER — Other Ambulatory Visit: Payer: Self-pay | Admitting: *Deleted

## 2019-10-20 ENCOUNTER — Other Ambulatory Visit: Payer: Self-pay

## 2019-10-20 DIAGNOSIS — Z20822 Contact with and (suspected) exposure to covid-19: Secondary | ICD-10-CM

## 2019-10-21 LAB — SARS-COV-2, NAA 2 DAY TAT

## 2019-10-21 LAB — NOVEL CORONAVIRUS, NAA: SARS-CoV-2, NAA: NOT DETECTED

## 2019-12-29 ENCOUNTER — Other Ambulatory Visit: Payer: Self-pay

## 2019-12-29 ENCOUNTER — Ambulatory Visit (HOSPITAL_COMMUNITY)
Admission: EM | Admit: 2019-12-29 | Discharge: 2019-12-29 | Disposition: A | Discharge status: Home / Self Care | Payer: Medicaid Other | Attending: Urgent Care | Admitting: Urgent Care

## 2019-12-29 ENCOUNTER — Encounter (HOSPITAL_COMMUNITY): Payer: Self-pay | Admitting: *Deleted

## 2019-12-29 DIAGNOSIS — R519 Headache, unspecified: Secondary | ICD-10-CM

## 2019-12-29 DIAGNOSIS — S0181XA Laceration without foreign body of other part of head, initial encounter: Secondary | ICD-10-CM

## 2019-12-29 MED ORDER — MELOXICAM 7.5 MG PO TABS: 7.5000 mg | ORAL_TABLET | Freq: Every day | ORAL | 0 refills | Status: DC

## 2019-12-29 MED ORDER — LIDOCAINE-EPINEPHRINE 1 %-1:100000 IJ SOLN
INTRAMUSCULAR | Status: AC
  Filled 2019-12-29: qty 1
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
## 2019-12-29 NOTE — Discharge Instructions (Signed)
WOUND CARE ?Please return in 6 days to have your stitches/staples removed or sooner if you have concerns. ? Keep area clean and dry for 24 hours. Do not remove bandage, if applied. ? After 24 hours, remove bandage and wash wound gently with mild soap and warm water. Reapply a new bandage after cleaning wound, if directed. ? Continue daily cleansing with soap and water until stitches/staples are removed. ? Do not apply any ointments or creams to the wound while stitches/staples are in place, as this may cause delayed healing. ? Notify the office if you experience any of the following signs of infection: Swelling, redness, pus drainage, streaking, fever >101.0 F ? Notify the office if you experience excessive bleeding that does not stop after 15-20 minutes of constant, firm pressure. ? ?
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
## 2020-01-05 ENCOUNTER — Ambulatory Visit (HOSPITAL_COMMUNITY)
Admission: EM | Admit: 2020-01-05 | Discharge: 2020-01-05 | Disposition: A | Discharge status: Home / Self Care | Payer: Medicaid Other | Attending: Urgent Care | Admitting: Urgent Care

## 2020-01-05 ENCOUNTER — Other Ambulatory Visit: Payer: Self-pay

## 2020-01-05 ENCOUNTER — Encounter (HOSPITAL_COMMUNITY): Payer: Self-pay | Admitting: Emergency Medicine

## 2020-01-05 NOTE — ED Triage Notes (Signed)
Pt presents for suture removal. Denies any infection or pain to site.

## 2020-07-17 ENCOUNTER — Other Ambulatory Visit: Payer: Self-pay

## 2020-07-17 ENCOUNTER — Encounter (HOSPITAL_COMMUNITY): Payer: Self-pay

## 2020-07-17 ENCOUNTER — Emergency Department (HOSPITAL_COMMUNITY)
Admission: EM | Admit: 2020-07-17 | Discharge: 2020-07-18 | Disposition: A | Discharge status: Home / Self Care | Payer: Medicaid Other | Attending: Emergency Medicine | Admitting: Emergency Medicine

## 2020-07-17 DIAGNOSIS — J45909 Unspecified asthma, uncomplicated: Secondary | ICD-10-CM | POA: Insufficient documentation

## 2020-07-17 DIAGNOSIS — X58XXXA Exposure to other specified factors, initial encounter: Secondary | ICD-10-CM | POA: Insufficient documentation

## 2020-07-17 DIAGNOSIS — Z87891 Personal history of nicotine dependence: Secondary | ICD-10-CM | POA: Diagnosis not present

## 2020-07-17 DIAGNOSIS — T18128A Food in esophagus causing other injury, initial encounter: Secondary | ICD-10-CM | POA: Diagnosis present

## 2020-07-17 DIAGNOSIS — I10 Essential (primary) hypertension: Secondary | ICD-10-CM | POA: Diagnosis not present

## 2020-07-17 DIAGNOSIS — K2289 Other specified disease of esophagus: Secondary | ICD-10-CM

## 2020-07-17 NOTE — ED Provider Notes (Signed)
Emergency Medicine Provider Triage Evaluation Note  Rhonda Kennedy , a 35 y.o. female  was evaluated in triage.  Pt complains of possible food bolus x1 week. Patient states she was eating [place] food 1 week ago when she felt like something got stuck in her throat. She states she has been able to eat and drink normally since; however, has had a persistent foreign body sensation in her throat. No nausea or vomiting. No difficulties breathing.   Review of Systems  Positive: Difficulty swallowing Negative: SOB  Physical Exam  BP (!) 125/97 (BP Location: Right Wrist)   Pulse 86   Temp 98.8 F (37.1 C) (Oral)   Resp 16   Ht 5\' 4"  (1.626 m)   Wt 70 kg   SpO2 100%   BMI 26.49 kg/m  Gen:   Awake, no distress   Resp:  Normal effort, no stridor or wheeze MSK:   Moves extremities without difficulty    Medical Decision Making  Medically screening exam initiated at 7:24 PM.  Appropriate orders placed.  Rhonda Kennedy was informed that the remainder of the evaluation will be completed by another provider, this initial triage assessment does not replace that evaluation, and the importance of remaining in the ED until their evaluation is complete.  Foreign body sensation in throat after eating food 1 week ago. No difficulties swallowing solids or liquids. No stridor or wheeze on exam.    [place], PA-C 07/17/20 07/19/20, MD 07/20/20 [phone]

## 2020-07-17 NOTE — ED Triage Notes (Signed)
Feels like food is stuck in her throat x 1 week. Pt able to speak in full sentences, swallow own secretions. Not in respi distress.

## 2020-07-18 ENCOUNTER — Encounter (HOSPITAL_COMMUNITY): Payer: Self-pay | Admitting: Student

## 2020-07-18 MED ORDER — SUCRALFATE 1 GM/10ML PO SUSP: 1.0000 g | Freq: Three times a day (TID) | ORAL | 0 refills | Status: DC

## 2020-07-18 MED ORDER — PANTOPRAZOLE SODIUM 20 MG PO TBEC: 20.0000 mg | DELAYED_RELEASE_TABLET | Freq: Every day | ORAL | 0 refills | Status: DC

## 2020-07-18 MED ORDER — LIDOCAINE VISCOUS HCL 2 % MT SOLN
15.0000 mL | Freq: Once | OROMUCOSAL | Status: AC
  Administered 2020-07-18: 15 mL via ORAL
  Filled 2020-07-18: qty 15

## 2020-07-18 MED ORDER — ALUM & MAG HYDROXIDE-SIMETH 200-200-20 MG/5ML PO SUSP
30.0000 mL | Freq: Once | ORAL | Status: AC
  Administered 2020-07-18: 30 mL via ORAL
  Filled 2020-07-18: qty 30

## 2020-07-18 NOTE — ED Notes (Signed)
Pt refusing VS recheck 

## 2020-07-18 NOTE — Discharge Instructions (Signed)
We are sending you home with the following medications to help with your symptoms:  - Protonix- please take 1 tablet in the morning prior to any meals to help with stomach acidity/pain.  - Carafate- please take prior to each meal and prior to bedtime to help with stomach acidity/pain.   We have prescribed you new medication(s) today. Discuss the medications prescribed today with your pharmacist as they can have adverse effects and interactions with your other medicines including over the counter and prescribed medications. Seek medical evaluation if you start to experience new or abnormal symptoms after taking one of these medicines, seek care immediately if you start to experience difficulty breathing, feeling of your throat closing, facial swelling, or rash as these could be indications of a more serious allergic reaction  Please follow attached diet guidelines to help avoid esophageal irritation.   Please call the GI office today as soon as they open to schedule an appointment as soon as possible.  Return to the ER for new or worsening symptoms including but not limited to worsened pain, new pain, inability to keep fluids down, trouble breathing, blood in vomit/stool, passing out, or any other concerns.

## 2020-07-18 NOTE — ED Provider Notes (Signed)
[facility] EMERGENCY DEPARTMENT Provider Note   CSN: [id number] Arrival date & time: 07/17/20  1859     History Chief complaint: Food stuck in throat.   Rhonda Kennedy is a 35 y.o. female with a hx of asthma, hypertension, anemia, and seizures who presents to the ED with complaints of "food stuck in my throat" x 1 week. Patient states she was eating chinese food and while she was eating rice she feels as though it got stuck in her throat mid chest. States she feels like something is stuck there, it is an aching discomfort, it is improved at times with certain PO intake, no other alleviating/aggravating factors. She has been tolerating PO food/fluids since. She denies vomiting, melena, hematochezia, hemoptysis, syncope, or constant dyspnea.   HPI     Past Medical History:  Diagnosis Date  . Asthma    Diagnosed as child  . Hemorrhoids   . History of bronchitis   . Hypertension    2012  . Seizures ([facility])    Onset 35 yo, most recent Dec 2018    Patient Active Problem List   Diagnosis Date Noted  . Routine screening for STI (sexually transmitted infection) 09/13/2016  . Family history of diabetes mellitus in maternal grandmother 01/12/2015  . Essential hypertension, benign 10/23/2013  . Smoking 10/23/2013  . Seizure disorder ([facility]) 11/14/2011  . Asthma 11/14/2011  . Anemia 08/23/2010  . Family history of breast cancer 08/23/2010    History reviewed. No pertinent surgical history.   OB History    Gravida  7   Para  2   Term  2   Preterm  0   AB  3   Living  2     SAB  0   IAB  3   Ectopic  0   Multiple  0   Live Births  2           Family History  Problem Relation Age of Onset  . Hypertension Mother   . Hyperlipidemia Mother   . Hypertension Sister   . Other Sister        anxiety  . Cancer Maternal Grandmother     Social History   Tobacco Use  . Smoking status: Former Smoker    Packs/day: 0.25    Years: 10.00    Pack  years: 2.50    Types: Cigarettes    Quit date: 12/23/2016    Years since quitting: 3.5  . Smokeless tobacco: Never Used  Vaping Use  . Vaping Use: Never used  Substance Use Topics  . Alcohol use: Yes    Alcohol/week: 3.0 standard drinks    Types: 3 Standard drinks or equivalent per week    Comment: per week  . Drug use: Yes    Types: Marijuana    Home Medications Prior to Admission medications   Medication Sig Start Date End Date Taking? Authorizing Provider  Cetirizine HCl 10 MG CAPS Take by mouth.    [provider]  levETIRAcetam (KEPPRA XR) 500 MG 24 hr tablet Take 1 tablet (500 mg total) by mouth daily. 03/05/17   Nilda Riggs, NP  meloxicam (MOBIC) 7.5 MG tablet Take 1 tablet (7.5 mg total) by mouth daily. 12/29/19   Wallis Bamberg, PA-C  norgestimate-ethinyl estradiol (SPRINTEC 28) 0.25-35 MG-MCG tablet Sprintec (28) 0.25 mg-35 mcg tablet  TAKE 1 TABLET BY MOUTH ONCE DAILY    [provider]  omeprazole (PRILOSEC) 20 MG capsule Take 1 capsule (  20 mg total) by mouth daily. 11/08/17   Fayrene Helper, PA-C  VITAMIN D PO Take by mouth.    [provider]    Allergies    Patient has no known allergies.  Review of Systems   Review of Systems  Constitutional: Negative for chills and fever.  Respiratory: Negative for cough.        Negative for dyspnea.   Cardiovascular: Negative for leg swelling.  Gastrointestinal: Negative for blood in stool, diarrhea and vomiting.       Positive for FB sensation/discomfort to mid esophagus central chest  Neurological: Negative for syncope.  All other systems reviewed and are negative.   Physical Exam Updated Vital Signs BP 121/90 (BP Location: Left Arm)   Pulse 66   Temp 98.8 F (37.1 C) (Oral)   Resp 16   Ht 5\' 4"  (1.626 m)   Wt 70 kg   SpO2 100%   BMI 26.49 kg/m   Physical Exam Vitals and nursing note reviewed.  Constitutional:      General: She is not in acute distress.    Appearance: She is  well-developed. She is not toxic-appearing.  HENT:     Head: Normocephalic and atraumatic.     Mouth/Throat:     Pharynx: Oropharynx is clear. Uvula midline.     Comments: Posterior oropharynx is symmetric appearing. Patient tolerating own secretions without difficulty. No trismus. No drooling. No hot potato voice. No swelling beneath the tongue, submandibular compartment is soft.  Eyes:     General:        Right eye: No discharge.        Left eye: No discharge.     Conjunctiva/sclera: Conjunctivae normal.  Neck:     Thyroid: No thyroid mass.     Trachea: Trachea normal.     Comments: Airway is patent.  Cardiovascular:     Rate and Rhythm: Normal rate and regular rhythm.  Pulmonary:     Effort: Pulmonary effort is normal. No respiratory distress.     Breath sounds: Normal breath sounds. No wheezing, rhonchi or rales.  Chest:     Chest wall: No tenderness.  Abdominal:     General: There is no distension.     Palpations: Abdomen is soft.     Tenderness: There is no abdominal tenderness. There is no guarding or rebound.  Musculoskeletal:     Cervical back: Neck supple. No edema, erythema, rigidity or crepitus.  Skin:    General: Skin is warm and dry.     Findings: No rash.  Neurological:     Mental Status: She is alert.     Comments: Clear speech.   Psychiatric:        Behavior: Behavior normal.     ED Results / Procedures / Treatments   Labs (all labs ordered are listed, but only abnormal results are displayed) Labs Reviewed - No data to display  EKG None  Radiology No results found.  Procedures Procedures   Medications Ordered in ED Medications  alum & mag hydroxide-simeth (MAALOX/MYLANTA) 200-200-20 MG/5ML suspension 30 mL (30 mLs Oral Given 07/18/20 0440)    And  lidocaine (XYLOCAINE) 2 % viscous mouth solution 15 mL (15 mLs Oral Given 07/18/20 0440)    ED Course  I have reviewed the triage vital signs and the nursing notes.  Pertinent labs & imaging  results that were available during my care of the patient were reviewed by me and considered in my medical decision making (see  chart for details).    MDM Rules/Calculators/A&P                          Patient presents to the ED for evaluation after she feels as though [place] food she was eating got stuck in her esophagus mid chest 1 week prior. She is nontoxic, resting comfortably, and her vital signs are not significantly abnormal, mildly elevated diastolic BP however this has normalized. Patient initially very frustrated with her ER experience thus far, sat with her at the bedside and was able to de-escalate. She has a reassuring physical exam. Oropharynx unremarkable, airway is patent, no palpable neck masses, heart/lung exam benign, abdomen is nontender w/o peritoneal signs. Based on history & physical exam patient does not appear to have an obstructive food bolus process as she has been tolerating PO food/fluids at home and is tolerating PO and her own secretions without difficulty in the ED. Abdomen benign, do not suspect acute surgical process. Possible esophageal abrasion/irritation/spasm, overall unclear definitive etiology. Given reassuring exam & ability to tolerate PO feel she is appropriate for discharge with close outpatient GI follow up. GI cocktail in the ED. PPI & carafate prescriptions. I discussed  treatment plan, need for follow-up, and return precautions with the patient who has confirmed understanding.   Additional history obtained:  Additional history obtained from chart review & nursing note review.   Findings and plan of care discussed with supervising physician Dr. Blinda Leatherwood who is in agreement.   Portions of this note were generated with Scientist, clinical (histocompatibility and immunogenetics). Dictation errors may occur despite best attempts at proofreading.  Final Clinical Impression(s) / ED Diagnoses Final diagnoses:  Esophagodynia    Rx / DC Orders ED Discharge Orders         Ordered     sucralfate (CARAFATE) 1 GM/10ML suspension  3 times daily with meals & bedtime        07/18/20 0440    pantoprazole (PROTONIX) 20 MG tablet  Daily        07/18/20 0440           Cherly Anderson, PA-C 07/18/20 0505    Gilda Crease, MD 07/19/20 2333

## 2020-07-18 NOTE — ED Notes (Signed)
E-signature pad unavailable at time of pt discharge. This RN discussed discharge materials with pt and answered all pt questions. Pt stated understanding of discharge material. ? ?

## 2020-07-18 NOTE — ED Notes (Addendum)
NT went in room to hook pt up to the monitor, I ask pt what was her complaint tonight, pt started cussing "Im tired of people asking me what the F**k I am here for" Pt would not let me updated vitals.

## 2020-07-20 ENCOUNTER — Ambulatory Visit: Payer: Medicaid Other | Admitting: Gastroenterology

## 2020-08-14 ENCOUNTER — Emergency Department (HOSPITAL_COMMUNITY)
Admission: EM | Admit: 2020-08-14 | Discharge: 2020-08-14 | Disposition: A | Discharge status: Home / Self Care | Payer: Medicaid Other | Attending: Emergency Medicine | Admitting: Emergency Medicine

## 2020-08-14 ENCOUNTER — Emergency Department (HOSPITAL_COMMUNITY): Payer: Medicaid Other

## 2020-08-14 ENCOUNTER — Encounter (HOSPITAL_COMMUNITY): Payer: Self-pay

## 2020-08-14 ENCOUNTER — Other Ambulatory Visit: Payer: Self-pay

## 2020-08-14 DIAGNOSIS — R059 Cough, unspecified: Secondary | ICD-10-CM | POA: Diagnosis present

## 2020-08-14 DIAGNOSIS — J3489 Other specified disorders of nose and nasal sinuses: Secondary | ICD-10-CM | POA: Diagnosis not present

## 2020-08-14 DIAGNOSIS — Z87891 Personal history of nicotine dependence: Secondary | ICD-10-CM | POA: Diagnosis not present

## 2020-08-14 DIAGNOSIS — Z20822 Contact with and (suspected) exposure to covid-19: Secondary | ICD-10-CM | POA: Insufficient documentation

## 2020-08-14 DIAGNOSIS — J181 Lobar pneumonia, unspecified organism: Secondary | ICD-10-CM | POA: Insufficient documentation

## 2020-08-14 DIAGNOSIS — I10 Essential (primary) hypertension: Secondary | ICD-10-CM | POA: Diagnosis not present

## 2020-08-14 DIAGNOSIS — J189 Pneumonia, unspecified organism: Secondary | ICD-10-CM

## 2020-08-14 DIAGNOSIS — J45909 Unspecified asthma, uncomplicated: Secondary | ICD-10-CM | POA: Insufficient documentation

## 2020-08-14 LAB — RESP PANEL BY RT-PCR (FLU A&B, COVID) ARPGX2
Influenza A by PCR: NEGATIVE
Influenza B by PCR: NEGATIVE
SARS Coronavirus 2 by RT PCR: NEGATIVE

## 2020-08-14 MED ORDER — AZITHROMYCIN 250 MG PO TABS: 250.0000 mg | ORAL_TABLET | Freq: Every day | ORAL | 0 refills | Status: DC

## 2020-08-14 MED ORDER — ALBUTEROL SULFATE HFA 108 (90 BASE) MCG/ACT IN AERS
2.0000 | INHALATION_SPRAY | RESPIRATORY_TRACT | Status: DC | PRN
  Administered 2020-08-14: 2 via RESPIRATORY_TRACT
  Filled 2020-08-14: qty 6.7

## 2020-08-14 MED ORDER — AZITHROMYCIN 250 MG PO TABS
500.0000 mg | ORAL_TABLET | Freq: Once | ORAL | Status: AC
  Administered 2020-08-14: 500 mg via ORAL
  Filled 2020-08-14: qty 2

## 2020-08-14 MED ORDER — BENZONATATE 100 MG PO CAPS: 100.0000 mg | ORAL_CAPSULE | Freq: Three times a day (TID) | ORAL | 0 refills | Status: DC

## 2020-08-14 NOTE — ED Provider Notes (Signed)
[facility]-EMERGENCY DEPT Provider Note   CSN: [id number] Arrival date & time: 08/14/20  0111     History Chief Complaint  Patient presents with   URI    Rhonda Kennedy is a 35 y.o. female.  Patient presents to the emergency department with a chief complaint of rhinorrhea, cough, and chest tightness.  She reports associated chest congestion.  She states that she thinks she has a cold.  She reports subjective fevers and chills.  Denies any successful treatments prior to arrival.  She states that she would like an x-ray.  The history is provided by the patient. No language interpreter was used.      Past Medical History:  Diagnosis Date   Asthma    Diagnosed as child   Hemorrhoids    History of bronchitis    Hypertension    2012   Seizures (HCC)    Onset 35 yo, most recent Dec 2018    Patient Active Problem List   Diagnosis Date Noted   Routine screening for STI (sexually transmitted infection) 09/13/2016   Family history of diabetes mellitus in maternal grandmother 01/12/2015   Essential hypertension, benign 10/23/2013   Smoking 10/23/2013   Seizure disorder (HCC) 11/14/2011   Asthma 11/14/2011   Anemia 08/23/2010   Family history of breast cancer 08/23/2010    History reviewed. No pertinent surgical history.   OB History     Gravida  7   Para  2   Term  2   Preterm  0   AB  3   Living  2      SAB  0   IAB  3   Ectopic  0   Multiple  0   Live Births  2           Family History  Problem Relation Age of Onset   Hypertension Mother    Hyperlipidemia Mother    Hypertension Sister    Other Sister        anxiety   Cancer Maternal Grandmother     Social History   Tobacco Use   Smoking status: Former    Packs/day: 0.25    Years: 10.00    Pack years: 2.50    Types: Cigarettes    Quit date: 12/23/2016    Years since quitting: 3.6   Smokeless tobacco: Never  Vaping Use   Vaping Use: Never used  Substance  Use Topics   Alcohol use: Yes    Alcohol/week: 3.0 standard drinks    Types: 3 Standard drinks or equivalent per week    Comment: per week   Drug use: Yes    Types: Marijuana    Home Medications Prior to Admission medications   Medication Sig Start Date End Date Taking? Authorizing Provider  azithromycin (ZITHROMAX) 250 MG tablet Take 1 tablet (250 mg total) by mouth daily. Take first 2 tablets together, then 1 every day until finished. 08/14/20  Yes Roxy Horseman, PA-C  benzonatate (TESSALON) 100 MG capsule Take 1 capsule (100 mg total) by mouth every 8 (eight) hours. 08/14/20  Yes Roxy Horseman, PA-C  Cetirizine HCl 10 MG CAPS Take by mouth.    [provider]  levETIRAcetam (KEPPRA XR) 500 MG 24 hr tablet Take 1 tablet (500 mg total) by mouth daily. 03/05/17   Nilda Riggs, NP  meloxicam (MOBIC) 7.5 MG tablet Take 1 tablet (7.5 mg total) by mouth daily. 12/29/19   Wallis Bamberg, PA-C  norgestimate-ethinyl estradiol (  SPRINTEC 28) 0.25-35 MG-MCG tablet Sprintec (28) 0.25 mg-35 mcg tablet  TAKE 1 TABLET BY MOUTH ONCE DAILY    [provider]  pantoprazole (PROTONIX) 20 MG tablet Take 1 tablet (20 mg total) by mouth daily. 07/18/20   Petrucelli, Samantha R, PA-C  sucralfate (CARAFATE) 1 GM/10ML suspension Take 10 mLs (1 g total) by mouth 4 (four) times daily -  with meals and at bedtime. 07/18/20   Petrucelli, Samantha R, PA-C  VITAMIN D PO Take by mouth.    [provider]  omeprazole (PRILOSEC) 20 MG capsule Take 1 capsule (20 mg total) by mouth daily. 11/08/17 07/18/20  Fayrene Helper, PA-C    Allergies    Patient has no known allergies.  Review of Systems   Review of Systems  All other systems reviewed and are negative.  Physical Exam Updated Vital Signs BP (!) 173/109 (BP Location: Left Arm)   Pulse 94   Temp 98.7 F (37.1 C) (Oral)   Resp 18   Ht 5\' 4"  (1.626 m)   Wt 70 kg   LMP 08/03/2020   SpO2 94%   BMI 26.49 kg/m   Physical  Exam Vitals and nursing note reviewed.  Constitutional:      General: She is not in acute distress.    Appearance: She is well-developed.  HENT:     Head: Normocephalic and atraumatic.  Eyes:     Conjunctiva/sclera: Conjunctivae normal.  Cardiovascular:     Rate and Rhythm: Normal rate and regular rhythm.     Heart sounds: No murmur heard. Pulmonary:     Effort: Pulmonary effort is normal. No respiratory distress.     Breath sounds: Normal breath sounds.  Abdominal:     Palpations: Abdomen is soft.     Tenderness: There is no abdominal tenderness.  Musculoskeletal:     Cervical back: Neck supple.  Skin:    General: Skin is warm and dry.  Neurological:     Mental Status: She is alert and oriented to person, place, and time.  Psychiatric:        Mood and Affect: Mood normal.        Behavior: Behavior normal.    ED Results / Procedures / Treatments   Labs (all labs ordered are listed, but only abnormal results are displayed) Labs Reviewed  RESP PANEL BY RT-PCR (FLU A&B, COVID) [id number]    EKG None  Radiology DG Chest 2 View  Result Date: 08/14/2020 CLINICAL DATA:  Cough EXAM: CHEST - 2 VIEW COMPARISON:  11/07/2017 FINDINGS: Nodular opacity in the right lower lobe. Lungs are otherwise clear. No pleural effusion or pneumothorax. Cardiomediastinal contours are normal. IMPRESSION: Nodular opacity in the right lower lobe may indicate infection. Electronically Signed   By: 01/07/2018 M.D.   On: 08/14/2020 02:05    Procedures Procedures   Medications Ordered in ED Medications - No data to display  ED Course  I have reviewed the triage vital signs and the nursing notes.  Pertinent labs & imaging results that were available during my care of the patient were reviewed by me and considered in my medical decision making (see chart for details).    MDM Rules/Calculators/A&P                          Patient here with cough and congestion.  Reports chest congestion as well.   Chest x-ray shows nodular opacity in the right lower lobe.  Will cover  with azithromycin.  COVID/flu pending.  Patient is nontoxic.  She appears stable for discharge and outpatient follow-up. Final Clinical Impression(s) / ED Diagnoses Final diagnoses:  Community acquired pneumonia of right lower lobe of lung    Rx / DC Orders ED Discharge Orders          Ordered    azithromycin (ZITHROMAX) 250 MG tablet  Daily        08/14/20 0210    benzonatate (TESSALON) 100 MG capsule  Every 8 hours        08/14/20 0210             Roxy Horseman, PA-C 08/14/20 0211    Palumbo, April, MD 08/14/20 [id number]

## 2020-08-14 NOTE — ED Triage Notes (Signed)
Pt to ED from home with report of cold like symptoms. Arrives A+O, VSS, NADN.

## 2020-08-18 ENCOUNTER — Encounter: Payer: Self-pay | Admitting: Gastroenterology

## 2020-08-18 ENCOUNTER — Other Ambulatory Visit: Payer: Self-pay

## 2020-08-18 ENCOUNTER — Ambulatory Visit (INDEPENDENT_AMBULATORY_CARE_PROVIDER_SITE_OTHER): Payer: Medicaid Other | Admitting: Gastroenterology

## 2020-08-18 VITALS — BP 174/120 | HR 75 | Ht 64.0 in | Wt 147.5 lb

## 2020-08-18 DIAGNOSIS — K219 Gastro-esophageal reflux disease without esophagitis: Secondary | ICD-10-CM | POA: Insufficient documentation

## 2020-08-18 DIAGNOSIS — R1310 Dysphagia, unspecified: Secondary | ICD-10-CM | POA: Insufficient documentation

## 2020-08-18 NOTE — Patient Instructions (Signed)
If you are age 35 or younger, your body mass index should be between 19-25. Your Body mass index is 25.32 kg/m. If this is out of the aformentioned range listed, please consider follow up with your Primary Care Provider.  __________________________________________________________  The [facility] providers would like to encourage you to use [employer] to communicate with providers for non-urgent requests or questions.  Due to long hold times on the telephone, sending your provider a message by [facility] may be a faster and more efficient way to get a response.  Please allow 48 business hours for a response.  Please remember that this is for non-urgent requests.   You have been scheduled for an endoscopy. Please follow written instructions given to you at your visit today. If you use inhalers (even only as needed), please bring them with you on the day of your procedure.  Thank you for entrusting me with your care and choosing [facility].  Doug Sou, PA-C

## 2020-08-18 NOTE — Progress Notes (Signed)
08/18/2020 Rhonda Kennedy [id number] 08-Jul-1985   HISTORY OF PRESENT ILLNESS: This is a 35 year old female who is new to our office.  She was told to follow-up here after recent ER visit after getting food stuck in her esophagus.  She tells me that she had been eating [place] food and rice got stuck in her esophagus.  She continued to have discomfort so a week later she went to the emergency department.  She obviously did not have signs of ongoing food impaction as she was tolerating her secretions and food and drink at home.  That was about 1 month ago.  She says that they gave her GI cocktail and then prescribed daily PPI and Carafate.  She took those and they seemed to help.  She reports having a lot of acid reflux, but she says that she likes to eat spicy foods that she knows that is her fault in that regards.  She had taken acid reflux medicines in the past.  She reports history with GI at [facility] a couple of years ago.  She reports having an EGD that she says overall from what she recalls did not really find much.   Past Medical History:  Diagnosis Date   Asthma    Diagnosed as child   Hemorrhoids    History of bronchitis    Hypertension    2012   Seizures (HCC)    Onset 35 yo, most recent Dec 2018   Past Surgical History:  Procedure Laterality Date   HEMORRHOID BANDING      reports that she quit smoking about 3 years ago. Her smoking use included cigarettes. She has a 2.50 pack-year smoking history. She has never used smokeless tobacco. She reports current alcohol use of about 3.0 standard drinks of alcohol per week. She reports current drug use. Drug: Marijuana. family history includes Cancer in her maternal grandmother; Crohn's disease in her paternal grandmother; Diabetes in her maternal grandmother; Hyperlipidemia in her mother; Hypertension in her mother and sister; Other in her sister. No Known Allergies    Outpatient Encounter Medications as of  08/18/2020  Medication Sig   benzonatate (TESSALON) 100 MG capsule Take 1 capsule (100 mg total) by mouth every 8 (eight) hours.   Cetirizine HCl 10 MG CAPS Take 10 mg by mouth daily.   PROAIR HFA 108 (90 Base) MCG/ACT inhaler SMARTSIG:1 Puff(s) By Mouth Every 4-6 Hours PRN   [DISCONTINUED] azithromycin (ZITHROMAX) 250 MG tablet Take 1 tablet (250 mg total) by mouth daily. Take first 2 tablets together, then 1 every day until finished. (Patient not taking: Reported on 08/18/2020)   [DISCONTINUED] levETIRAcetam (KEPPRA XR) 500 MG 24 hr tablet Take 1 tablet (500 mg total) by mouth daily. (Patient not taking: Reported on 08/18/2020)   [DISCONTINUED] meloxicam (MOBIC) 7.5 MG tablet Take 1 tablet (7.5 mg total) by mouth daily. (Patient not taking: Reported on 08/18/2020)   [DISCONTINUED] norgestimate-ethinyl estradiol (ORTHO-CYCLEN) 0.25-35 MG-MCG tablet Sprintec (28) 0.25 mg-35 mcg tablet  TAKE 1 TABLET BY MOUTH ONCE DAILY (Patient not taking: Reported on 08/18/2020)   [DISCONTINUED] omeprazole (PRILOSEC) 20 MG capsule Take 1 capsule (20 mg total) by mouth daily.   [DISCONTINUED] pantoprazole (PROTONIX) 20 MG tablet Take 1 tablet (20 mg total) by mouth daily. (Patient not taking: Reported on 08/18/2020)   [DISCONTINUED] sucralfate (CARAFATE) 1 GM/10ML suspension Take 10 mLs (1 g total) by mouth 4 (four) times daily -  with meals and at bedtime. (Patient not taking:  Reported on 08/18/2020)   [DISCONTINUED] VITAMIN D PO Take by mouth. (Patient not taking: Reported on 08/18/2020)   No facility-administered encounter medications on file as of 08/18/2020.     REVIEW OF SYSTEMS  : All other systems reviewed and negative except where noted in the History of Present Illness.   PHYSICAL EXAM: BP (!) 174/120 (BP Location: Right Arm, Patient Position: Sitting, Cuff Size: Normal)   Pulse 75   Ht 5\' 4"  (1.626 m)   Wt 147 lb 8 oz (66.9 kg)   LMP 08/03/2020   SpO2 99%   BMI 25.32 kg/m  General: Well developed AA  female in no acute distress Head: Normocephalic and atraumatic Eyes:  Sclerae anicteric, conjunctiva pink. Ears: Normal auditory acuity Lungs: Clear throughout to auscultation; no W/R/R. Heart: Regular rate and rhythm; no M/R/G. Abdomen: Soft, non-distended.  BS present.  Mild epigastric TTP. Musculoskeletal: Symmetrical with no gross deformities  Skin: No lesions on visible extremities Extremities: No edema  Neurological: Alert oriented x 4, grossly non-focal Psychological:  Alert and cooperative. Normal mood and affect  ASSESSMENT AND PLAN: *Dysphagia: Reports getting 10/03/2020 food, rice stuck about a month ago.  Sounds like she had at least a transient food impaction.  She went to the ER about a week later for this and obviously there was no sign of ongoing food impaction, but she likely was still just having ongoing discomfort from the area of irritation where it had been stuck previously.  At this point she is feeling better and has not experienced any other episodes.  She has been trying to decrease her amount of spicy foods, etc. as she does report frequent heartburn/reflux symptoms.  We will plan for EGD with Dr. [place] next week with possible dilation.  Rule out stricture vs EoE vs esophagitis, etc.  She reports having an EGD performed at [facility] in the past.  We will try to get those records.  The risks, benefits, and alternatives to EGD with dilation were discussed with the patient and she consents to proceed.   CC:  [facility] MD

## 2020-08-18 NOTE — Progress Notes (Signed)
Reviewed and agree with management plans. ? ?Andrena Margerum L. Marabeth Melland, MD, MPH  ?

## 2020-08-22 ENCOUNTER — Ambulatory Visit (AMBULATORY_SURGERY_CENTER): Payer: Medicaid Other | Admitting: Gastroenterology

## 2020-08-22 ENCOUNTER — Encounter: Payer: Self-pay | Admitting: Gastroenterology

## 2020-08-22 ENCOUNTER — Other Ambulatory Visit: Payer: Self-pay

## 2020-08-22 VITALS — BP 152/103 | HR 71 | Temp 97.9°F | Resp 12 | Ht 64.0 in | Wt 147.0 lb

## 2020-08-22 DIAGNOSIS — K2100 Gastro-esophageal reflux disease with esophagitis, without bleeding: Secondary | ICD-10-CM | POA: Diagnosis not present

## 2020-08-22 DIAGNOSIS — R1310 Dysphagia, unspecified: Secondary | ICD-10-CM

## 2020-08-22 DIAGNOSIS — K449 Diaphragmatic hernia without obstruction or gangrene: Secondary | ICD-10-CM | POA: Diagnosis not present

## 2020-08-22 DIAGNOSIS — K2289 Other specified disease of esophagus: Secondary | ICD-10-CM

## 2020-08-22 DIAGNOSIS — K219 Gastro-esophageal reflux disease without esophagitis: Secondary | ICD-10-CM

## 2020-08-22 MED ORDER — SODIUM CHLORIDE 0.9 % IV SOLN: 500.0000 mL | Freq: Once | INTRAVENOUS | Status: DC

## 2020-08-22 NOTE — Op Note (Signed)
[facility] Patient Name: Rhonda Kennedy Procedure Date: 08/22/2020 7:28 AM MRN: [id number] Endoscopist: Tressia Danas MD, MD Age: 35 Referring MD:  Date of Birth: 07-31-1985 Gender: Female Account #: [id number] Procedure:                Upper GI endoscopy Indications:              Dysphagia, Suspected gastro-esophageal reflux                            disease, Choking Medicines:                Monitored Anesthesia Care Procedure:                Pre-Anesthesia Assessment:                           - Prior to the procedure, a History and Physical                            was performed, and patient medications and                            allergies were reviewed. The patient's tolerance of                            previous anesthesia was also reviewed. The risks                            and benefits of the procedure and the sedation                            options and risks were discussed with the patient.                            All questions were answered, and informed consent                            was obtained. Prior Anticoagulants: The patient has                            taken no previous anticoagulant or antiplatelet                            agents. ASA Grade Assessment: II - A patient with                            mild systemic disease. After reviewing the risks                            and benefits, the patient was deemed in                            satisfactory condition to undergo the procedure.  After obtaining informed consent, the endoscope was                            passed under direct vision. Throughout the                            procedure, the patient's blood pressure, pulse, and                            oxygen saturations were monitored continuously. The                            GIF [id number] #[id number] was introduced through the                            mouth, and advanced to the third part of  duodenum.                            The upper GI endoscopy was accomplished without                            difficulty. The patient tolerated the procedure                            well. Scope In: Scope Out: Findings:                 LA Grade A (one or more mucosal breaks less than 5                            mm, not extending between tops of 2 mucosal folds)                            esophagitis with no bleeding was found 35 cm from                            the incisors. Biopsies were taken with a cold                            forceps for histology. Estimated blood loss was                            minimal.                           A medium-sized hiatal hernia was present.                           The stomach was normal.                           The examined duodenum was normal. Complications:            No immediate complications. Estimated blood loss:  Minimal. Estimated Blood Loss:     Estimated blood loss was minimal. Impression:               - LA Grade A reflux esophagitis with no bleeding.                            Biopsied.                           - Medium-sized hiatal hernia. Recommendation:           - Patient has a contact number available for                            emergencies. The signs and symptoms of potential                            delayed complications were discussed with the                            patient. Return to normal activities tomorrow.                            Written discharge instructions were provided to the                            patient.                           - Resume previous diet.                           - Continue present medications.                           - Await pathology results.                           - Modified barium swallow to further evaluate her                            symptoms.                           - Follow-up in the office to review these results.                             She reports receiving at least 5 different                            medications for her symptoms without adherence to                            any of them. Will tailor therapy with her at the                            time of that office  visit based on the results from                            her biopsies today and her swallowing study. Tressia Danas MD, MD 08/22/2020 7:55:07 AM This report has been signed electronically.

## 2020-08-22 NOTE — Patient Instructions (Signed)
YOU HAD AN ENDOSCOPIC PROCEDURE TODAY AT THE [facility]:   Refer to the procedure report that was given to you for any specific questions about what was found during the examination.  If the procedure report does not answer your questions, please call your gastroenterologist to clarify.  If you requested that your care partner not be given the details of your procedure findings, then the procedure report has been included in a sealed envelope for you to review at your convenience later.  YOU SHOULD EXPECT: Some feelings of bloating in the abdomen. Passage of more gas than usual.  Walking can help get rid of the air that was put into your GI tract during the procedure and reduce the bloating. If you had a lower endoscopy (such as a colonoscopy or flexible sigmoidoscopy) you may notice spotting of blood in your stool or on the toilet paper. If you underwent a bowel prep for your procedure, you may not have a normal bowel movement for a few days.  Please Note:  You might notice some irritation and congestion in your nose or some drainage.  This is from the oxygen used during your procedure.  There is no need for concern and it should clear up in a day or so.  SYMPTOMS TO REPORT IMMEDIATELY:   Following lower endoscopy (colonoscopy or flexible sigmoidoscopy):  Excessive amounts of blood in the stool  Significant tenderness or worsening of abdominal pains  Swelling of the abdomen that is new, acute  Fever of 100F or higher   Following upper endoscopy (EGD)  Vomiting of blood or coffee ground material  New chest pain or pain under the shoulder blades  Painful or persistently difficult swallowing  New shortness of breath  Fever of 100F or higher  Black, tarry-looking stools  For urgent or emergent issues, a gastroenterologist can be reached at any hour by calling [phone]. Do not use MyChart messaging for urgent concerns.    DIET:  We do recommend a small meal at first, but  then you may proceed to your regular diet.  Drink plenty of fluids but you should avoid alcoholic beverages for 24 hours.  ACTIVITY:  You should plan to take it easy for the rest of today and you should NOT DRIVE or use heavy machinery until tomorrow (because of the sedation medicines used during the test).    FOLLOW UP: Our staff will call the number listed on your records 48-72 hours following your procedure to check on you and address any questions or concerns that you may have regarding the information given to you following your procedure. If we do not reach you, we will leave a message.  We will attempt to reach you two times.  During this call, we will ask if you have developed any symptoms of COVID 19. If you develop any symptoms (ie: fever, flu-like symptoms, shortness of breath, cough etc.) before then, please call [phone].  If you test positive for Covid 19 in the 2 weeks post procedure, please call and report this information to us.    If any biopsies were taken you will be contacted by phone or by letter within the next 1-3 weeks.  Please call us at [phone] if you have not heard about the biopsies in 3 weeks.    SIGNATURES/CONFIDENTIALITY: You and/or your care partner have signed paperwork which will be entered into your electronic medical record.  These signatures attest to the fact that that the information above on   your After Visit Summary has been reviewed and is understood.  Full responsibility of the confidentiality of this discharge information lies with you and/or your care-partner. 

## 2020-08-22 NOTE — Progress Notes (Signed)
Report to PACU, RN, vss, BBS= Clear.  

## 2020-08-22 NOTE — Progress Notes (Signed)
Called to room to assist during endoscopic procedure.  Patient ID and intended procedure confirmed with present staff. Received instructions for my participation in the procedure from the performing physician.  

## 2020-08-24 ENCOUNTER — Telehealth: Payer: Self-pay | Admitting: *Deleted

## 2020-08-24 ENCOUNTER — Other Ambulatory Visit: Payer: Self-pay

## 2020-08-24 ENCOUNTER — Telehealth: Payer: Self-pay

## 2020-08-24 DIAGNOSIS — R1310 Dysphagia, unspecified: Secondary | ICD-10-CM

## 2020-08-24 NOTE — Telephone Encounter (Signed)
Have you developed a fever since your procedure? no  2.   Have you had an respiratory symptoms (SOB or cough) since your procedure? no  3.   Have you tested positive for COVID 19 since your procedure no  4.   Have you had any family members/close contacts diagnosed with the COVID 19 since your procedure?  no   If yes to any of these questions please route to Laverna Peace, RN and Karlton Lemon, RN  Follow up Call-  Call back number 08/22/2020  Post procedure Call Back phone  # [phone]  Permission to leave phone message Yes  Some recent data might be hidden     Patient questions:  Do you have a fever, pain , or abdominal swelling? No. Pain Score  0 *  Have you tolerated food without any problems? Yes.    Have you been able to return to your normal activities? Yes.    Do you have any questions about your discharge instructions: Diet   No. Medications  No. Follow up visit  No.  Do you have questions or concerns about your Care? No.  Actions: * If pain score is 4 or above: No action needed, pain <4.

## 2020-08-25 ENCOUNTER — Telehealth: Payer: Self-pay

## 2020-08-25 ENCOUNTER — Other Ambulatory Visit: Payer: Self-pay

## 2020-08-25 ENCOUNTER — Telehealth: Payer: Self-pay | Admitting: Gastroenterology

## 2020-08-25 DIAGNOSIS — R1310 Dysphagia, unspecified: Secondary | ICD-10-CM

## 2020-08-25 NOTE — Telephone Encounter (Signed)
Order in epic for MBS, rehab will contact pt for appt. Note sent to scheduling for appt to be scheduled with Doug Sou PA.

## 2020-08-25 NOTE — Telephone Encounter (Signed)
Pt called stating that she was returning the nurse call from yesterday. Pls call her again.

## 2020-08-25 NOTE — Telephone Encounter (Signed)
Let pt know she will get a call from the rehab dept at the hospital to schedule the Modified barium swallow.

## 2020-08-25 NOTE — Telephone Encounter (Signed)
-----   Message from Tressia Danas, MD sent at 08/22/2020  7:47 AM EDT ----- Please arrange for modified barium swallow and then office follow-up with Shanda Bumps for "choking" and dysphagia.  Thank you.  KLB

## 2020-08-26 ENCOUNTER — Telehealth (HOSPITAL_COMMUNITY): Payer: Self-pay | Admitting: *Deleted

## 2020-08-26 ENCOUNTER — Other Ambulatory Visit (HOSPITAL_COMMUNITY): Payer: Self-pay | Admitting: *Deleted

## 2020-08-26 DIAGNOSIS — R1310 Dysphagia, unspecified: Secondary | ICD-10-CM

## 2020-08-26 NOTE — Telephone Encounter (Signed)
Attempted to call patient to schedule OP MBS, VM was full. Will try at a later date. RKEEL

## 2020-08-31 ENCOUNTER — Other Ambulatory Visit: Payer: Self-pay

## 2020-08-31 MED ORDER — PANTOPRAZOLE SODIUM 40 MG PO TBEC: 40.0000 mg | DELAYED_RELEASE_TABLET | Freq: Two times a day (BID) | ORAL | 3 refills | Status: DC

## 2020-09-02 ENCOUNTER — Other Ambulatory Visit: Payer: Self-pay

## 2020-09-02 ENCOUNTER — Ambulatory Visit (HOSPITAL_COMMUNITY)
Admission: RE | Admit: 2020-09-02 | Discharge: 2020-09-02 | Disposition: A | Discharge status: Home / Self Care | Payer: Medicaid Other | Source: Ambulatory Visit | Attending: Gastroenterology | Admitting: Gastroenterology

## 2020-09-02 DIAGNOSIS — R1310 Dysphagia, unspecified: Secondary | ICD-10-CM

## 2020-09-13 ENCOUNTER — Ambulatory Visit: Payer: Medicaid Other | Admitting: Gastroenterology

## 2020-12-22 ENCOUNTER — Ambulatory Visit: Payer: Medicaid Other | Admitting: Gastroenterology

## 2021-03-12 ENCOUNTER — Emergency Department (HOSPITAL_COMMUNITY)
Admission: EM | Admit: 2021-03-12 | Discharge: 2021-03-13 | Disposition: A | Discharge status: Home / Self Care | Payer: Medicaid Other | Attending: Emergency Medicine | Admitting: Emergency Medicine

## 2021-03-12 ENCOUNTER — Encounter (HOSPITAL_COMMUNITY): Payer: Self-pay

## 2021-03-12 ENCOUNTER — Other Ambulatory Visit: Payer: Self-pay

## 2021-03-12 DIAGNOSIS — L02411 Cutaneous abscess of right axilla: Secondary | ICD-10-CM | POA: Insufficient documentation

## 2021-03-12 NOTE — ED Triage Notes (Signed)
Pt reports with abscess under right axilla x 2 days. Pt states that it started draining today.

## 2021-03-13 MED ORDER — DOXYCYCLINE HYCLATE 100 MG PO CAPS: 100.0000 mg | ORAL_CAPSULE | Freq: Two times a day (BID) | ORAL | 0 refills | Status: DC

## 2021-03-13 NOTE — ED Notes (Signed)
I&D tray at bedside.

## 2021-03-13 NOTE — ED Provider Notes (Signed)
[facility]-EMERGENCY DEPT Provider Note   CSN: [id number] Arrival date & time: 03/12/21  2311     History  Chief Complaint  Patient presents with   Abscess    Rhonda Kennedy is a 36 y.o. female.  The history is provided by the patient.  Abscess Rhonda Kennedy is a 36 y.o. female who presents to the Emergency Department complaining of skin infection.  She presents to the emergency department for swelling and pain to the right axillary area that started 2 days ago.  The area spontaneously drained prior to ED arrival with purulent and bloody material.  No prior similar episodes.  No associated fevers, chills, nausea, vomiting.  Denies any chance of pregnancy.    Home Medications Prior to Admission medications   Medication Sig Start Date End Date Taking? Authorizing Provider  doxycycline (VIBRAMYCIN) 100 MG capsule Take 1 capsule (100 mg total) by mouth 2 (two) times daily. 03/13/21  Yes Tilden Fossa, MD  benzonatate (TESSALON) 100 MG capsule Take 1 capsule (100 mg total) by mouth every 8 (eight) hours. Patient not taking: Reported on 08/22/2020 08/14/20   Roxy Horseman, PA-C  Cetirizine HCl 10 MG CAPS Take 10 mg by mouth daily.    [provider]  famotidine (PEPCID) 10 MG tablet Take 10 mg by mouth 2 (two) times daily.    [provider]  pantoprazole (PROTONIX) 40 MG tablet Take 1 tablet (40 mg total) by mouth 2 (two) times daily. 08/31/20   Tressia Danas, MD  PROAIR HFA 108 [phone] Base) MCG/ACT inhaler SMARTSIG:1 Puff(s) By Mouth Every 4-6 Hours PRN 03/29/20   [provider]  omeprazole (PRILOSEC) 20 MG capsule Take 1 capsule (20 mg total) by mouth daily. 11/08/17 07/18/20  Fayrene Helper, PA-C      Allergies    Patient has no known allergies.    Review of Systems   Review of Systems  All other systems reviewed and are negative.  Physical Exam Updated Vital Signs BP (!) 141/111 (BP Location: Left Arm)    Pulse 79    Temp 97.9  F (36.6 C) (Oral)    Resp 20    Ht 5\' 4"  (1.626 m)    Wt 64.9 kg    SpO2 100%    BMI 24.55 kg/m  Physical Exam Vitals and nursing note reviewed.  Constitutional:      Appearance: She is well-developed.  HENT:     Head: Normocephalic and atraumatic.  Cardiovascular:     Rate and Rhythm: Normal rate and regular rhythm.  Pulmonary:     Effort: Pulmonary effort is normal. No respiratory distress.  Musculoskeletal:     Comments: There is a 2 cm area of swelling and tenderness in the right axillary region with a small amount of surrounding erythema.  There is a 1 to 2 mm central opening with a small amount of blood present.  This area is locally tender to palpation.  Skin:    General: Skin is warm and dry.  Neurological:     Mental Status: She is alert and oriented to person, place, and time.  Psychiatric:        Behavior: Behavior normal.    ED Results / Procedures / Treatments   Labs (all labs ordered are listed, but only abnormal results are displayed) Labs Reviewed - No data to display  EKG None  Radiology No results found.  Procedures Procedures    Medications Ordered in ED Medications - No data  to display  ED Course/ Medical Decision Making/ A&P                           Medical Decision Making  Patient here for evaluation of right axillary pain.  On examination she has a spontaneously drained abscess with mild local erythema.  This abscess has drained well and does not need repeat I&D at this time.  Discussed with patient home care for abscess.  Will prescribe antibiotics with return precautions for progressive or new concerning symptoms.        Final Clinical Impression(s) / ED Diagnoses Final diagnoses:  Abscess of axilla, right    Rx / DC Orders ED Discharge Orders          Ordered    doxycycline (VIBRAMYCIN) 100 MG capsule  2 times daily        03/13/21 0308              Tilden Fossa, MD 03/13/21 [phone]

## 2022-01-09 ENCOUNTER — Emergency Department (HOSPITAL_COMMUNITY)
Admission: EM | Admit: 2022-01-09 | Discharge: 2022-01-09 | Disposition: A | Discharge status: Home / Self Care | Payer: Medicaid Other | Attending: Emergency Medicine | Admitting: Emergency Medicine

## 2022-01-09 ENCOUNTER — Encounter (HOSPITAL_COMMUNITY): Payer: Self-pay

## 2022-01-09 ENCOUNTER — Emergency Department (HOSPITAL_COMMUNITY): Payer: Medicaid Other

## 2022-01-09 DIAGNOSIS — J069 Acute upper respiratory infection, unspecified: Secondary | ICD-10-CM | POA: Diagnosis not present

## 2022-01-09 DIAGNOSIS — R059 Cough, unspecified: Secondary | ICD-10-CM | POA: Diagnosis present

## 2022-01-09 DIAGNOSIS — Z20822 Contact with and (suspected) exposure to covid-19: Secondary | ICD-10-CM | POA: Diagnosis not present

## 2022-01-09 DIAGNOSIS — R0789 Other chest pain: Secondary | ICD-10-CM | POA: Insufficient documentation

## 2022-01-09 DIAGNOSIS — J45909 Unspecified asthma, uncomplicated: Secondary | ICD-10-CM | POA: Insufficient documentation

## 2022-01-09 LAB — RESP PANEL BY RT-PCR (FLU A&B, COVID) ARPGX2
Influenza A by PCR: NEGATIVE
Influenza B by PCR: NEGATIVE
SARS Coronavirus 2 by RT PCR: NEGATIVE

## 2022-01-09 MED ORDER — NAPROXEN 500 MG PO TABS: 500.0000 mg | ORAL_TABLET | Freq: Two times a day (BID) | ORAL | 0 refills | Status: DC

## 2022-01-09 MED ORDER — BENZONATATE 100 MG PO CAPS: 100.0000 mg | ORAL_CAPSULE | Freq: Three times a day (TID) | ORAL | 0 refills | Status: DC

## 2022-01-09 NOTE — Discharge Instructions (Addendum)
It was a pleasure taking care of you today.  As discussed, your COVID and flu test were negative.  Your chest x-ray did not show evidence of pneumonia.  I suspect you have a viral infection.  Cough after a viral infection can last up to 3 weeks.  I am sending you home with cough medication.  I am also sending you home with pain medication for your chest wall pain.  Take as needed.  Do not take medications if there is a chance you are pregnant.  Follow-up with PCP if symptoms not improve over the next few days.  Return to the ER for any worsening symptoms.

## 2022-01-09 NOTE — ED Triage Notes (Signed)
Pt arrived via POV, c/o productive cough, green sputum. Concerned for sinus infection, but sx progressively worse.

## 2022-01-09 NOTE — ED Provider Notes (Signed)
[facility]-EMERGENCY DEPT Provider Note   CSN: [id number] Arrival date & time: 01/09/22  1524     History  Chief Complaint  Patient presents with   Cough    Rhonda Kennedy is a 36 y.o. female with a past medical history significant for seizures, asthma, and GERD who presents the ED due to productive cough, congestion, and sinus pressure x6 days.  Patient also states her chest hurts when she presses on her chest wall.  Son sick with similar symptoms.  No shortness of breath.  Denies abdominal pain, nausea, and vomiting.  History obtained from patient and past medical records. No interpreter used during encounter.       Home Medications Prior to Admission medications   Medication Sig Start Date End Date Taking? Authorizing Provider  benzonatate (TESSALON) 100 MG capsule Take 1 capsule (100 mg total) by mouth every 8 (eight) hours. 01/09/22  Yes Drystan Reader C, PA-C  naproxen (NAPROSYN) 500 MG tablet Take 1 tablet (500 mg total) by mouth 2 (two) times daily. 01/09/22  Yes Sharonda Llamas C, PA-C  benzonatate (TESSALON) 100 MG capsule Take 1 capsule (100 mg total) by mouth every 8 (eight) hours. Patient not taking: Reported on 08/22/2020 08/14/20   Roxy Horseman, PA-C  Cetirizine HCl 10 MG CAPS Take 10 mg by mouth daily.    [provider]  doxycycline (VIBRAMYCIN) 100 MG capsule Take 1 capsule (100 mg total) by mouth 2 (two) times daily. 03/13/21   Tilden Fossa, MD  famotidine (PEPCID) 10 MG tablet Take 10 mg by mouth 2 (two) times daily.    [provider]  pantoprazole (PROTONIX) 40 MG tablet Take 1 tablet (40 mg total) by mouth 2 (two) times daily. 08/31/20   Tressia Danas, MD  PROAIR HFA 108 [phone] Base) MCG/ACT inhaler SMARTSIG:1 Puff(s) By Mouth Every 4-6 Hours PRN 03/29/20   [provider]  omeprazole (PRILOSEC) 20 MG capsule Take 1 capsule (20 mg total) by mouth daily. 11/08/17 07/18/20  Fayrene Helper, PA-C       Allergies    Patient has no known allergies.    Review of Systems   Review of Systems  Constitutional:  Negative for chills and fever.  HENT:  Positive for congestion, rhinorrhea, sinus pressure and sore throat.   Respiratory:  Positive for cough. Negative for shortness of breath.   Cardiovascular:  Positive for chest pain (chest wall pain).  Gastrointestinal:  Negative for abdominal pain, diarrhea, nausea and vomiting.  All other systems reviewed and are negative.   Physical Exam Updated Vital Signs BP (!) 148/103   Pulse 74   Temp 98.7 F (37.1 C) (Oral)   Resp 18   Ht 5\' 4"  (1.626 m)   Wt 63.5 kg   SpO2 99%   BMI 24.03 kg/m  Physical Exam Vitals and nursing note reviewed.  Constitutional:      General: She is not in acute distress.    Appearance: She is not ill-appearing.  HENT:     Head: Normocephalic.     Nose:     Comments: No tenderness over sinuses.  Eyes:     Pupils: Pupils are equal, round, and reactive to light.  Cardiovascular:     Rate and Rhythm: Normal rate and regular rhythm.     Pulses: Normal pulses.     Heart sounds: Normal heart sounds. No murmur heard.    No friction rub. No gallop.  Pulmonary:     Effort: Pulmonary effort  is normal.     Breath sounds: Normal breath sounds.     Comments: Respirations equal and unlabored, patient able to speak in full sentences, lungs clear to auscultation bilaterally Abdominal:     General: Abdomen is flat. There is no distension.     Palpations: Abdomen is soft.     Tenderness: There is no abdominal tenderness. There is no guarding or rebound.  Musculoskeletal:        General: Normal range of motion.     Cervical back: Neck supple.  Skin:    General: Skin is warm and dry.  Neurological:     General: No focal deficit present.     Mental Status: She is alert.  Psychiatric:        Mood and Affect: Mood normal.        Behavior: Behavior normal.     ED Results / Procedures / Treatments   Labs (all  labs ordered are listed, but only abnormal results are displayed) Labs Reviewed  RESP PANEL BY RT-PCR (FLU A&B, COVID) [id number]    EKG EKG Interpretation  Date/Time:  Tuesday January 09 2022 16:36:36 EST Ventricular Rate:  65 PR Interval:  154 QRS Duration: 94 QT Interval:  370 QTC Calculation: 385 R Axis:   55 Text Interpretation: Sinus rhythm Biatrial enlargement Confirmed by Alona Bene [phone]) on 01/09/2022 4:40:45 PM  Radiology DG Chest 1 View  Result Date: 01/09/2022 CLINICAL DATA:  Chest pain EXAM: CHEST  1 VIEW COMPARISON:  08/14/2020 FINDINGS: The heart size and mediastinal contours are within normal limits. Both lungs are clear. The visualized skeletal structures are unremarkable. IMPRESSION: No active disease. Electronically Signed   By: Jasmine Pang M.D.   On: 01/09/2022 17:13    Procedures Procedures    Medications Ordered in ED Medications - No data to display  ED Course/ Medical Decision Making/ A&P                           Medical Decision Making Amount and/or Complexity of Data Reviewed Labs: ordered. Decision-making details documented in ED Course. Radiology: ordered and independent interpretation performed. Decision-making details documented in ED Course. ECG/medicine tests: ordered and independent interpretation performed. Decision-making details documented in ED Course.   36 year old female presents to the ED due to cough, nasal congestion, and sinus pressure.  She also admits to chest wall pain.  Son sick with similar symptoms.  Upon arrival, patient afebrile, not tachycardic or hypoxic.  Patient in no acute distress.  Reassuring physical exam.  Lungs clear to auscultation bilaterally.  Tenderness throughout chest wall without crepitus or deformity.  Abdomen soft, nondistended, nontender.  Low suspicion for acute abdomen.  No meningismus to suggest meningitis.  No tenderness throughout sinuses.  Low suspicion for sinusitis.  COVID/influenza test  ordered.  Chest x-ray and EKG given chest pain. CXR to rule out pneumonia. Low suspicion for cardiac etiology of chest pain given tenderness on exam.  Possible MSK etiology.  COVID/influenza negative.  Chest x-ray personally reviewed and interpreted which are negative for signs of pneumonia, pneumothorax, or widened mediastinum.  Agree with radiology interpretation.  EKG demonstrates normal sinus rhythm.  No signs of acute ischemia.  Suspect viral etiology.  Low suspicion for cardiac etiology of chest pain.  Patient discharged with symptomatic treatment.  Advised patient to follow-up with PCP if symptoms not improve over the next few days. Strict ED precautions discussed with patient. Patient states understanding and agrees to  plan. Patient discharged home in no acute distress and stable vitals       Final Clinical Impression(s) / ED Diagnoses Final diagnoses:  Viral URI with cough  Chest wall pain    Rx / DC Orders ED Discharge Orders          Ordered    naproxen (NAPROSYN) 500 MG tablet  2 times daily        01/09/22 2122    benzonatate (TESSALON) 100 MG capsule  Every 8 hours        01/09/22 2122              Suzy Bouchard, PA-C 01/09/22 2124    Long, Wonda Olds, MD 01/12/22 0422

## 2022-01-09 NOTE — ED Provider Triage Note (Signed)
Emergency Medicine Provider Triage Evaluation Note  Rhonda Kennedy , a 36 y.o. female  was evaluated in triage.  Pt complains of nasal congestion, cough, sinus pressure x6 days.  Patient also admits to chest pain when "pressing on her chest". Son sick with similar symptoms.  Review of Systems  Positive: Nasal congestion, cough Negative: SOB  Physical Exam  BP (!) 148/103   Pulse 74   Temp 98.7 F (37.1 C) (Oral)   Resp 18   Ht 5\' 4"  (1.626 m)   Wt 63.5 kg   SpO2 99%   BMI 24.03 kg/m  Gen:   Awake, no distress   Resp:  Normal effort  MSK:   Moves extremities without difficulty  Other:    Medical Decision Making  Medically screening exam initiated at 4:27 PM.  Appropriate orders placed.  Rhonda Kennedy was informed that the remainder of the evaluation will be completed by another provider, this initial triage assessment does not replace that evaluation, and the importance of remaining in the ED until their evaluation is complete.  COVID/flu test CXR EKG   , PA-C 01/09/22 1628

## 2022-04-03 NOTE — Progress Notes (Unsigned)
New Patient Note  RE: Rhonda Kennedy MRN: [id number] DOB: 09-29-1985 Date of Office Visit: 04/04/2022  Consult requested by: Sherald Hess., MD Primary care provider: Sherald Hess., MD  Chief Complaint: No chief complaint on file.  History of Present Illness: I had the pleasure of seeing Rhonda Kennedy for initial evaluation at the [facility] on 04/03/2022. She is a 37 y.o. female, who is referred here by Sherald Hess., MD for the evaluation of ***.  ***  Assessment and Plan: [name] is a 37 y.o. female with: No problem-specific Assessment & Plan notes found for this encounter.  No follow-ups on file.  No orders of the defined types were placed in this encounter.  Lab Orders  No laboratory test(s) ordered today    Other allergy screening: Asthma: {Blank single:19197::"yes","no"} Rhino conjunctivitis: {Blank single:19197::"yes","no"} Food allergy: {Blank single:19197::"yes","no"} Medication allergy: {Blank single:19197::"yes","no"} Hymenoptera allergy: {Blank single:19197::"yes","no"} Urticaria: {Blank single:19197::"yes","no"} Eczema:{Blank single:19197::"yes","no"} History of recurrent infections suggestive of immunodeficency: {Blank single:19197::"yes","no"}  Diagnostics: Spirometry:  Tracings reviewed. Her effort: {Blank single:19197::"Good reproducible efforts.","It was hard to get consistent efforts and there is a question as to whether this reflects a maximal maneuver.","Poor effort, data can not be interpreted."} FVC: ***L FEV1: ***L, ***% predicted FEV1/FVC ratio: ***% Interpretation: {Blank single:19197::"Spirometry consistent with mild obstructive disease","Spirometry consistent with moderate obstructive disease","Spirometry consistent with severe obstructive disease","Spirometry consistent with possible restrictive disease","Spirometry consistent with mixed obstructive and restrictive disease","Spirometry uninterpretable  due to technique","Spirometry consistent with normal pattern","No overt abnormalities noted given today's efforts"}.  Please see scanned spirometry results for details.  Skin Testing: {Blank single:19197::"Select foods","Environmental allergy panel","Environmental allergy panel and select foods","Food allergy panel","None","Deferred due to recent antihistamines use"}. *** Results discussed with patient/family.   Past Medical History: Patient Active Problem List   Diagnosis Date Noted  . Dysphagia 08/18/2020  . Gastroesophageal reflux disease 08/18/2020  . Routine screening for STI (sexually transmitted infection) 09/13/2016  . Family history of diabetes mellitus in maternal grandmother 01/12/2015  . Essential hypertension, benign 10/23/2013  . Smoking 10/23/2013  . Seizure disorder ([place]) 11/14/2011  . Asthma 11/14/2011  . Anemia 08/23/2010  . Family history of breast cancer 08/23/2010   Past Medical History:  Diagnosis Date  . Allergy   . Anemia   . Asthma    Diagnosed as child  . Hemorrhoids   . History of bronchitis   . Hypertension    2012  . Seizures ([place])    Onset 37 yo, most recent Dec 2018   Past Surgical History: Past Surgical History:  Procedure Laterality Date  . HEMORRHOID BANDING     Medication List:  Current Outpatient Medications  Medication Sig Dispense Refill  . benzonatate (TESSALON) 100 MG capsule Take 1 capsule (100 mg total) by mouth every 8 (eight) hours. (Patient not taking: Reported on 08/22/2020) 21 capsule 0  . benzonatate (TESSALON) 100 MG capsule Take 1 capsule (100 mg total) by mouth every 8 (eight) hours. 21 capsule 0  . Cetirizine HCl 10 MG CAPS Take 10 mg by mouth daily.    Marland Kitchen doxycycline (VIBRAMYCIN) 100 MG capsule Take 1 capsule (100 mg total) by mouth 2 (two) times daily. 14 capsule 0  . famotidine (PEPCID) 10 MG tablet Take 10 mg by mouth 2 (two) times daily.    . naproxen (NAPROSYN) 500 MG tablet Take 1 tablet (500 mg total) by mouth 2  (two) times daily. 30 tablet 0  . pantoprazole (PROTONIX) 40 MG  tablet Take 1 tablet (40 mg total) by mouth 2 (two) times daily. 60 tablet 3  . PROAIR HFA 108 (90 Base) MCG/ACT inhaler SMARTSIG:1 Puff(s) By Mouth Every 4-6 Hours PRN     No current facility-administered medications for this visit.   Allergies: No Known Allergies Social History: Social History   Socioeconomic History  . Marital status: Single    Spouse name: Not on file  . Number of children: Not on file  . Years of education: Not on file  . Highest education level: Not on file  Occupational History  . Not on file  Tobacco Use  . Smoking status: Former    Packs/day: 0.25    Years: 10.00    Total pack years: 2.50    Types: Cigarettes    Quit date: 12/23/2016    Years since quitting: 5.2  . Smokeless tobacco: Never  Vaping Use  . Vaping Use: Former  Substance and Sexual Activity  . Alcohol use: Yes    Alcohol/week: 3.0 standard drinks of alcohol    Types: 3 Standard drinks or equivalent per week    Comment: per week  . Drug use: Yes    Types: Marijuana  . Sexual activity: Yes  Other Topics Concern  . Not on file  Social History Narrative  . Not on file   Social Determinants of Health   Financial Resource Strain: Not on file  Food Insecurity: Not on file  Transportation Needs: Not on file  Physical Activity: Not on file  Stress: Not on file  Social Connections: Not on file   Lives in a ***. Smoking: *** Occupation: ***  Environmental HistoryFreight forwarder in the house: Estate agent in the family room: {Blank single:19197::"yes","no"} Carpet in the bedroom: {Blank single:19197::"yes","no"} Heating: {Blank single:19197::"electric","gas","heat pump"} Cooling: {Blank single:19197::"central","window","heat pump"} Pet: {Blank single:19197::"yes ***","no"}  Family History: Family History  Problem Relation Age of Onset  . Hypertension Mother   . Hyperlipidemia  Mother   . Hypertension Sister   . Other Sister        anxiety  . Diabetes Maternal Grandmother   . Cancer Maternal Grandmother   . Crohn's disease Paternal Grandmother   . Esophageal cancer Neg Hx   . Colon cancer Neg Hx   . Stomach cancer Neg Hx   . Pancreatic cancer Neg Hx   . Rectal cancer Neg Hx    Problem                               Relation Asthma                                   *** Eczema                                *** Food allergy                          *** Allergic rhino conjunctivitis     ***  Review of Systems  Constitutional:  Negative for appetite change, chills, fever and unexpected weight change.  HENT:  Negative for congestion and rhinorrhea.   Eyes:  Negative for itching.  Respiratory:  Negative for cough, chest tightness, shortness of breath and wheezing.   Cardiovascular:  Negative for chest  pain.  Gastrointestinal:  Negative for abdominal pain.  Genitourinary:  Negative for difficulty urinating.  Skin:  Negative for rash.  Neurological:  Negative for headaches.   Objective: There were no vitals taken for this visit. There is no height or weight on file to calculate BMI. Physical Exam Vitals and nursing note reviewed.  Constitutional:      Appearance: Normal appearance. She is well-developed.  HENT:     Head: Normocephalic and atraumatic.     Right Ear: Tympanic membrane and external ear normal.     Left Ear: Tympanic membrane and external ear normal.     Nose: Nose normal.     Mouth/Throat:     Mouth: Mucous membranes are moist.     Pharynx: Oropharynx is clear.  Eyes:     Conjunctiva/sclera: Conjunctivae normal.  Cardiovascular:     Rate and Rhythm: Normal rate and regular rhythm.     Heart sounds: Normal heart sounds. No murmur heard.    No friction rub. No gallop.  Pulmonary:     Effort: Pulmonary effort is normal.     Breath sounds: Normal breath sounds. No wheezing, rhonchi or rales.  Musculoskeletal:     Cervical back: Neck  supple.  Skin:    General: Skin is warm.     Findings: No rash.  Neurological:     Mental Status: She is alert and oriented to person, place, and time.  Psychiatric:        Behavior: Behavior normal.  The plan was reviewed with the patient/family, and all questions/concerned were addressed.  It was my pleasure to see Brion today and participate in her care. Please feel free to contact me with any questions or concerns.  Sincerely,  Rexene Alberts, DO Allergy & Immunology  [facility] office: [place] office: [phone]

## 2022-04-04 ENCOUNTER — Other Ambulatory Visit: Payer: Self-pay

## 2022-04-04 ENCOUNTER — Encounter: Payer: Self-pay | Admitting: Allergy

## 2022-04-04 ENCOUNTER — Ambulatory Visit: Payer: Medicaid Other | Admitting: Allergy

## 2022-04-04 VITALS — BP 122/76 | HR 90 | Temp 98.7°F | Resp 18 | Ht 62.0 in | Wt 145.9 lb

## 2022-04-04 DIAGNOSIS — K219 Gastro-esophageal reflux disease without esophagitis: Secondary | ICD-10-CM

## 2022-04-04 DIAGNOSIS — J4530 Mild persistent asthma, uncomplicated: Secondary | ICD-10-CM

## 2022-04-04 DIAGNOSIS — J310 Chronic rhinitis: Secondary | ICD-10-CM | POA: Diagnosis not present

## 2022-04-04 MED ORDER — IPRATROPIUM BROMIDE 0.03 % NA SOLN: 1.0000 | Freq: Two times a day (BID) | NASAL | 3 refills | Status: DC | PRN

## 2022-04-04 MED ORDER — BUDESONIDE-FORMOTEROL FUMARATE 80-4.5 MCG/ACT IN AERO: 2.0000 | INHALATION_SPRAY | Freq: Two times a day (BID) | RESPIRATORY_TRACT | 3 refills | Status: DC

## 2022-04-04 NOTE — Patient Instructions (Signed)
Today's skin testing showed: Negative to indoor/outdoor allergies.   Results given.  Non-allergic rhinitis Use Atrovent (ipratropium) 0.03% 1-2 sprays per nostril twice a day as needed for runny nose/drainage. Nasal saline spray (i.e., Simply Saline) or nasal saline lavage (i.e., NeilMed) is recommended as needed and prior to medicated nasal sprays. Refer to ENT next to look at sinus anatomy. Try to stop smoking - it can be irritating to your nasal passages.  See handout.   Heartburn: See handout for lifestyle and dietary modifications. Take pantoprazole 40mg  once a day in the mornings. Nothing to eat or drink for 30 minutes afterwards.   Breathing Daily controller medication(s): start Symbicort 101mcg 2 puffs twice a day with spacer and rinse mouth afterwards. Spacer given and demonstrated proper use with inhaler. Patient understood technique and all questions/concerned were addressed.  May use albuterol rescue inhaler 2 puffs every 4 to 6 hours as needed for shortness of breath, chest tightness, coughing, and wheezing. Monitor frequency of use.  Breathing control goals:  Full participation in all desired activities (may need albuterol before activity) Albuterol use two times or less a week on average (not counting use with activity) Cough interfering with sleep two times or less a month Oral steroids no more than once a year No hospitalizations  Follow up in 2 months or sooner if needed.

## 2022-04-05 ENCOUNTER — Encounter: Payer: Self-pay | Admitting: Allergy

## 2022-04-05 ENCOUNTER — Telehealth: Payer: Self-pay

## 2022-04-05 DIAGNOSIS — J310 Chronic rhinitis: Secondary | ICD-10-CM | POA: Insufficient documentation

## 2022-04-05 NOTE — Assessment & Plan Note (Signed)
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 Tried zyrtec and Rhinocort with some benefit. No prior allergy evaluation.  Today's skin testing showed: Negative to indoor/outdoor allergies.  Use Atrovent (ipratropium) 0.03% 1-2 sprays per nostril twice a day as needed for runny nose/drainage. Nasal saline spray (i.e., Simply Saline) or nasal saline lavage (i.e., NeilMed) is recommended as needed and prior to medicated nasal sprays. Refer to ENT next to look at sinus anatomy. Try to stop smoking - it can be irritating to your nasal passages.  See handout.

## 2022-04-05 NOTE — Telephone Encounter (Signed)
Please refer patient to ENT for sinus anatomy evaluation.

## 2022-04-05 NOTE — Assessment & Plan Note (Signed)
See handout for lifestyle and dietary modifications. Take pantoprazole 40mg  once a day in the mornings. Nothing to eat or drink for 30 minutes afterwards.

## 2022-04-05 NOTE — Assessment & Plan Note (Signed)
Patient denies prior diagnosis of asthma but has albuterol and uses it sometimes with unknown benefit. Today's spirometry showed: possible restrictive disease with 59% improvement in FEV1 post bronchodilator treatment. Clinically feeling improved.  Question if patient is underperceiver of her symptoms. Daily controller medication(s): start Symbicort 34mcg 2 puffs twice a day with spacer and rinse mouth afterwards. Spacer given and demonstrated proper use with inhaler. Patient understood technique and all questions/concerned were addressed.  May use albuterol rescue inhaler 2 puffs every 4 to 6 hours as needed for shortness of breath, chest tightness, coughing, and wheezing. Monitor frequency of use.  Get spirometry at next visit.

## 2022-06-03 NOTE — Progress Notes (Deleted)
Follow Up Note  RE: Rhonda Kennedy MRN: [id number] DOB: August 16, 1985 Date of Office Visit: 06/04/2022  Referring provider: Frederich Chick., MD Primary care provider: Frederich Chick., MD  Chief Complaint: No chief complaint on file.  History of Present Illness: I had the pleasure of seeing Rhonda Kennedy for a follow up visit at the [facility] on 06/03/2022. She is a 37 y.o. female, who is being followed for chronic rhinitis, asthma, GERD. Her previous allergy office visit was on 04/04/2022 with Dr. Selena Batten. Today is a regular follow up visit.  Chronic rhinitis Perennial rhino conjunctivitis symptoms which flare in the spring and summer. Tried zyrtec and Rhinocort with some benefit. No prior allergy evaluation.  Today's skin testing showed: Negative to indoor/outdoor allergies.  Use Atrovent (ipratropium) 0.03% 1-2 sprays per nostril twice a day as needed for runny nose/drainage. Nasal saline spray (i.e., Simply Saline) or nasal saline lavage (i.e., NeilMed) is recommended as needed and prior to medicated nasal sprays. Refer to ENT next to look at sinus anatomy. Try to stop smoking - it can be irritating to your nasal passages.  See handout.    Asthma Patient denies prior diagnosis of asthma but has albuterol and uses it sometimes with unknown benefit. Today's spirometry showed: possible restrictive disease with 59% improvement in FEV1 post bronchodilator treatment. Clinically feeling improved.  Question if patient is underperceiver of her symptoms. Daily controller medication(s): start Symbicort 2 puffs twice a day with spacer and rinse mouth afterwards. Spacer given and demonstrated proper use with inhaler. Patient understood technique and all questions/concerned were addressed.  May use albuterol rescue inhaler 2 puffs every 4 to 6 hours as needed for shortness of breath, chest tightness, coughing, and wheezing. Monitor frequency of use.  Get spirometry  at next visit.   Gastroesophageal reflux disease See handout for lifestyle and dietary modifications. Take pantoprazole 40mg  once a day in the mornings. Nothing to eat or drink for 30 minutes afterwards.   Assessment and Plan: Rhonda Kennedy is a 37 y.o. female with: No problem-specific Assessment & Plan notes found for this encounter.  No follow-ups on file.  No orders of the defined types were placed in this encounter.  Lab Orders  No laboratory test(s) ordered today    Diagnostics: Spirometry:  Tracings reviewed. Her effort: {Blank single:19197::"Good reproducible efforts.","It was hard to get consistent efforts and there is a question as to whether this reflects a maximal maneuver.","Poor effort, data can not be interpreted."} FVC: ***L FEV1: ***L, ***% predicted FEV1/FVC ratio: ***% Interpretation: {Blank single:19197::"Spirometry consistent with mild obstructive disease","Spirometry consistent with moderate obstructive disease","Spirometry consistent with severe obstructive disease","Spirometry consistent with possible restrictive disease","Spirometry consistent with mixed obstructive and restrictive disease","Spirometry uninterpretable due to technique","Spirometry consistent with normal pattern","No overt abnormalities noted given today's efforts"}.  Please see scanned spirometry results for details.  Skin Testing: {Blank single:19197::"Select foods","Environmental allergy panel","Environmental allergy panel and select foods","Food allergy panel","None","Deferred due to recent antihistamines use"}. *** Results discussed with patient/family.   Medication List:  Current Outpatient Medications  Medication Sig Dispense Refill  . budesonide-formoterol (SYMBICORT) 80-4.5 MCG/ACT inhaler Inhale 2 puffs into the lungs in the morning and at bedtime. with spacer and rinse mouth afterwards. 1 each 3  . Cetirizine HCl 10 MG CAPS Take 10 mg by mouth daily.    Marland Kitchen ipratropium (ATROVENT) 0.03 %  nasal spray Place 1-2 sprays into both nostrils 2 (two) times daily as needed (nasal drainage). 30 mL 3  .  pantoprazole (PROTONIX) 40 MG tablet Take 1 tablet (40 mg total) by mouth 2 (two) times daily. 60 tablet 3  . PROAIR HFA 108 (90 Base) MCG/ACT inhaler SMARTSIG:1 Puff(s) By Mouth Every 4-6 Hours PRN     No current facility-administered medications for this visit.   Allergies: No Known Allergies I reviewed her past medical history, social history, family history, and environmental history and no significant changes have been reported from her previous visit.  Review of Systems  Constitutional:  Negative for appetite change, chills, fever and unexpected weight change.  HENT:  Positive for congestion, postnasal drip, rhinorrhea and sneezing.   Eyes:  Positive for itching.  Respiratory:  Negative for cough, chest tightness, shortness of breath and wheezing.   Cardiovascular:  Negative for chest pain.  Gastrointestinal:  Negative for abdominal pain.  Genitourinary:  Negative for difficulty urinating.  Skin:  Negative for rash.  Allergic/Immunologic: Negative for environmental allergies.  Neurological:  Negative for headaches.   Objective: There were no vitals taken for this visit. There is no height or weight on file to calculate BMI. Physical Exam Vitals and nursing note reviewed.  Constitutional:      Appearance: Normal appearance. She is well-developed.  HENT:     Head: Normocephalic and atraumatic.     Right Ear: Tympanic membrane and external ear normal.     Left Ear: Tympanic membrane and external ear normal.     Nose: Nose normal.     Mouth/Throat:     Mouth: Mucous membranes are moist.     Pharynx: Oropharynx is clear.  Eyes:     Conjunctiva/sclera: Conjunctivae normal.  Cardiovascular:     Rate and Rhythm: Normal rate and regular rhythm.     Heart sounds: Normal heart sounds. No murmur heard.    No friction rub. No gallop.  Pulmonary:     Effort: Pulmonary effort  is normal.     Breath sounds: Normal breath sounds. No wheezing, rhonchi or rales.  Musculoskeletal:     Cervical back: Neck supple.  Skin:    General: Skin is warm.     Findings: No rash.  Neurological:     Mental Status: She is alert and oriented to person, place, and time.  Psychiatric:        Behavior: Behavior normal.  Previous notes and tests were reviewed. The plan was reviewed with the patient/family, and all questions/concerned were addressed.  It was my pleasure to see Rhonda Kennedy today and participate in her care. Please feel free to contact me with any questions or concerns.  Sincerely,  Wyline Mood, DO Allergy & Immunology  [facility] office: [phone] [facility] office: [phone]

## 2022-06-04 ENCOUNTER — Ambulatory Visit: Payer: Medicaid Other | Admitting: Allergy

## 2022-06-04 DIAGNOSIS — J310 Chronic rhinitis: Secondary | ICD-10-CM

## 2022-06-04 DIAGNOSIS — J4530 Mild persistent asthma, uncomplicated: Secondary | ICD-10-CM

## 2022-06-04 DIAGNOSIS — K219 Gastro-esophageal reflux disease without esophagitis: Secondary | ICD-10-CM

## 2022-10-14 IMAGING — CR DG CHEST 2V
2 series · 2 of 2 positions shown · non-contrast
Comparison: 11/07/2017

CLINICAL DATA: Cough

EXAM:
CHEST - 2 VIEW

[w chest pa]
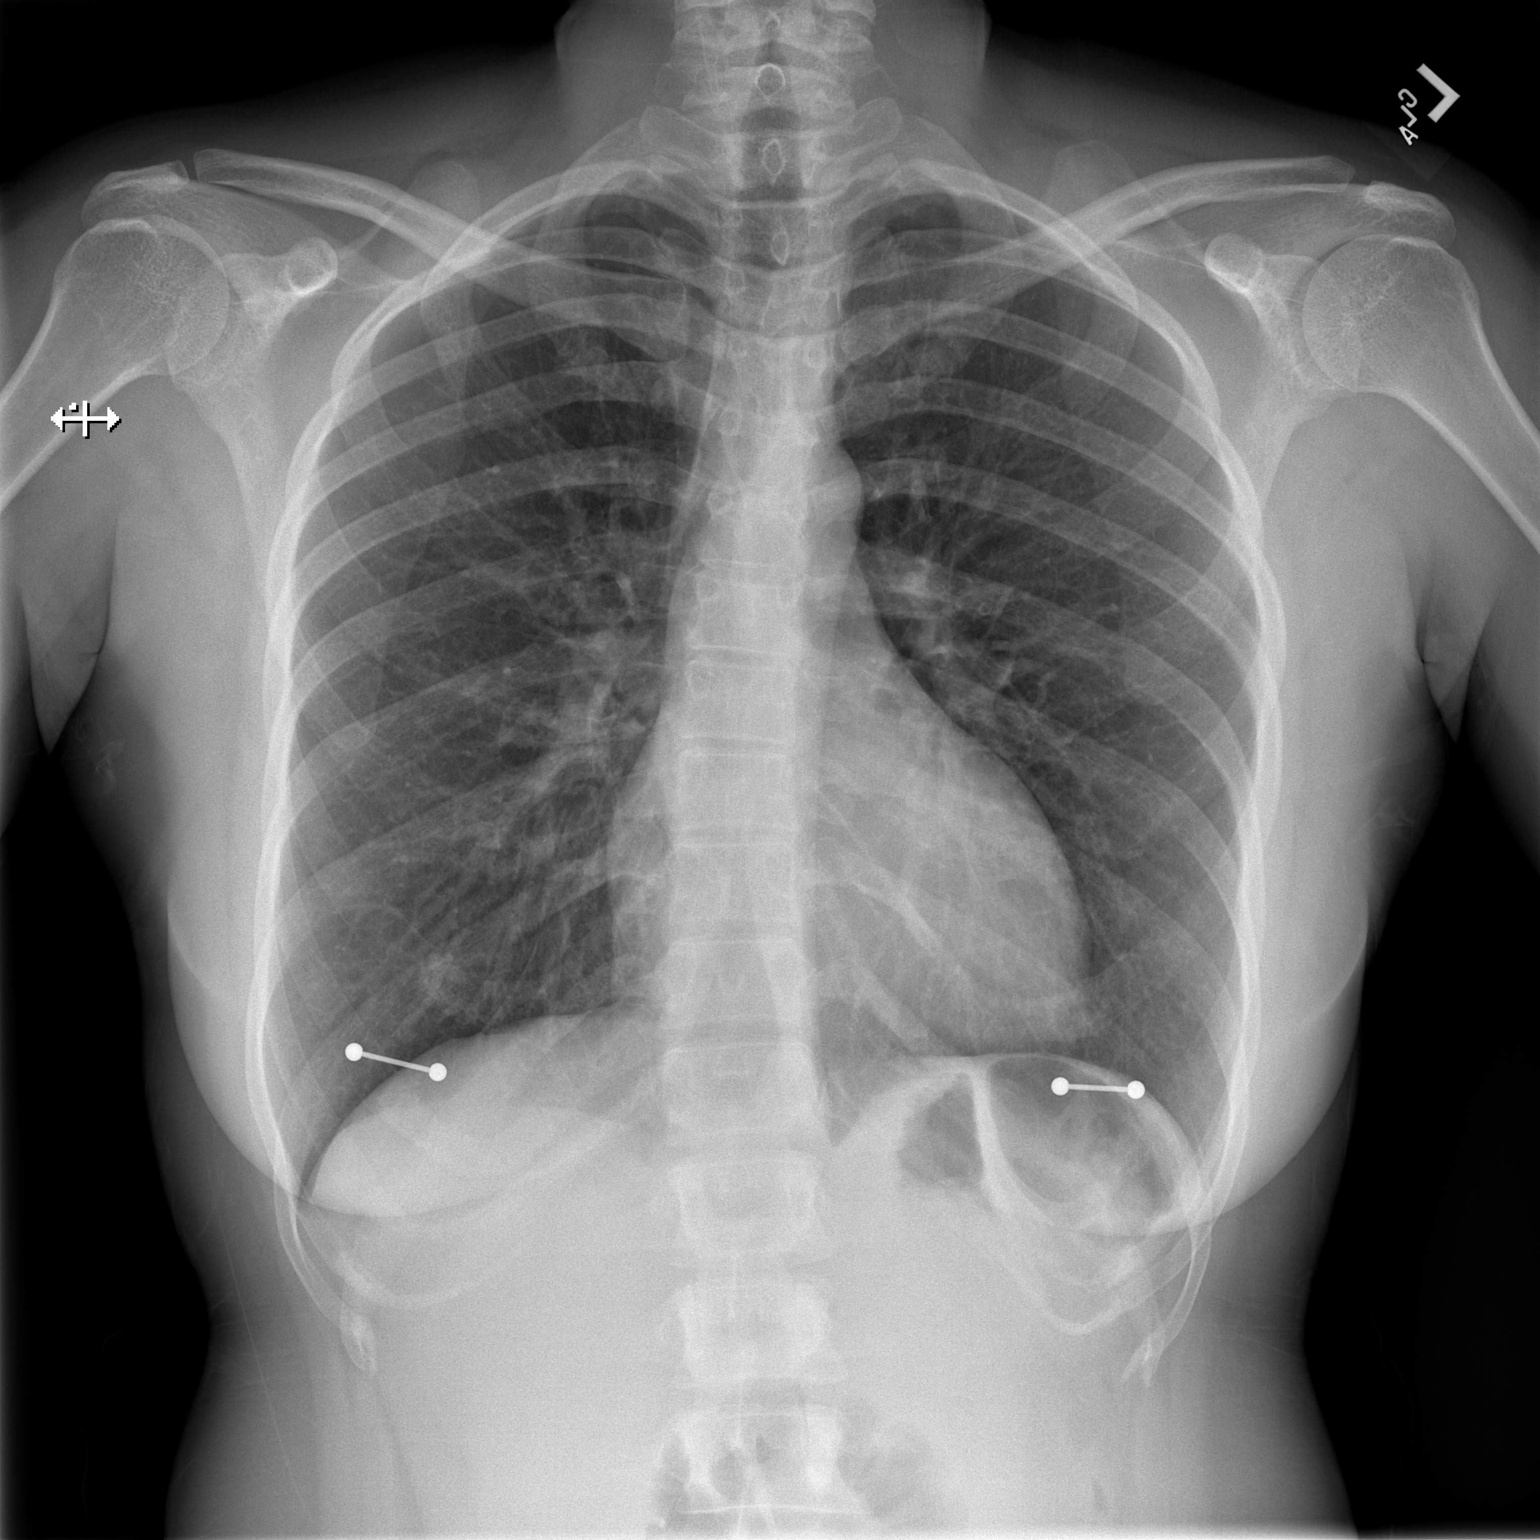

[w chest lat]
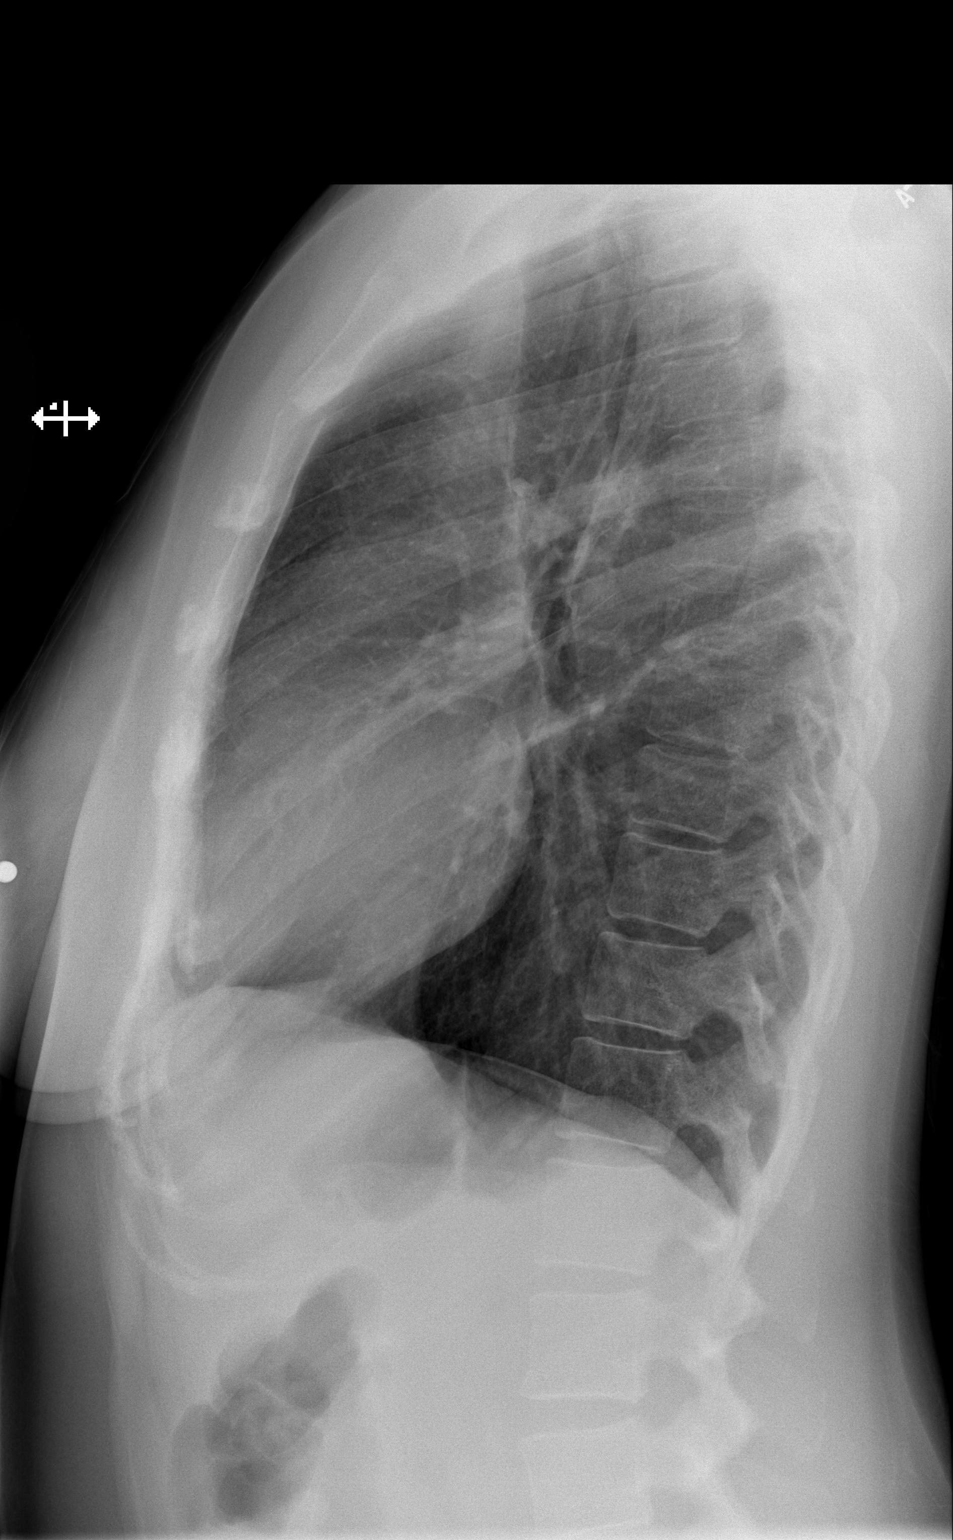

[2 of 2 positions shown; findings below may reference images not displayed]

FINDINGS: Nodular opacity in the right lower lobe. Lungs are otherwise clear.
No pleural effusion or pneumothorax. Cardiomediastinal contours are
normal.
IMPRESSION: Nodular opacity in the right lower lobe may indicate infection.

## 2022-11-28 ENCOUNTER — Other Ambulatory Visit: Payer: Self-pay

## 2022-11-28 ENCOUNTER — Encounter (HOSPITAL_COMMUNITY): Payer: Self-pay | Admitting: Emergency Medicine

## 2022-11-28 ENCOUNTER — Emergency Department (HOSPITAL_COMMUNITY)
Admission: EM | Admit: 2022-11-28 | Discharge: 2022-11-29 | Disposition: A | Discharge status: Home / Self Care | Payer: Medicaid Other | Attending: Emergency Medicine | Admitting: Emergency Medicine

## 2022-11-28 DIAGNOSIS — J029 Acute pharyngitis, unspecified: Secondary | ICD-10-CM | POA: Diagnosis present

## 2022-11-28 DIAGNOSIS — J069 Acute upper respiratory infection, unspecified: Secondary | ICD-10-CM | POA: Diagnosis not present

## 2022-11-28 DIAGNOSIS — Z1152 Encounter for screening for COVID-19: Secondary | ICD-10-CM | POA: Insufficient documentation

## 2022-11-28 NOTE — ED Triage Notes (Signed)
Pt c/o continued left sided sore throat x 2 weeks. Was diagnosed with strep, states that she finished the abx.

## 2022-11-29 ENCOUNTER — Emergency Department (HOSPITAL_COMMUNITY): Payer: Medicaid Other

## 2022-11-29 LAB — SARS CORONAVIRUS 2 BY RT PCR: SARS Coronavirus 2 by RT PCR: NEGATIVE

## 2022-11-29 MED ORDER — ONDANSETRON 4 MG PO TBDP: ORAL_TABLET | ORAL | 0 refills | Status: DC

## 2022-11-29 MED ORDER — BENZONATATE 100 MG PO CAPS: 100.0000 mg | ORAL_CAPSULE | Freq: Three times a day (TID) | ORAL | 0 refills | Status: DC

## 2022-11-29 NOTE — ED Provider Notes (Addendum)
[place] EMERGENCY DEPARTMENT AT [facility] Provider Note   CSN: [id number] Arrival date & time: 11/28/22  2130     History  Chief Complaint  Patient presents with   Sore Throat    Rhonda Kennedy is a 37 y.o. female.  29 yoF with a chief concern of sore throat cough and congestion.  Has been going on for a little bit over a week.  She was seen initially at an outside hospital and was diagnosed with strep pharyngitis and started on antibiotics.  She has completed the antibiotics but still feels like she has a sore throat.  She think she was misdiagnosed and never actually had strep.  She denies any specific sick contacts.  She is having a little bit of left upper chest discomfort.  She wonders if maybe she has pneumonia or something stuck in her throat.  Has been coughing up a bit of phlegm.  No fevers.   Sore Throat       Home Medications Prior to Admission medications   Medication Sig Start Date End Date Taking? Authorizing Provider  benzonatate (TESSALON) 100 MG capsule Take 1 capsule (100 mg total) by mouth every 8 (eight) hours. 11/29/22  Yes Melene Plan, DO  ondansetron (ZOFRAN-ODT) 4 MG disintegrating tablet 4mg  ODT q4 hours prn nausea/vomit 11/29/22  Yes Melene Plan, DO  budesonide-formoterol (SYMBICORT) 80-4.5 MCG/ACT inhaler Inhale 2 puffs into the lungs in the morning and at bedtime. with spacer and rinse mouth afterwards. 04/04/22   Ellamae Sia, DO  Cetirizine HCl 10 MG CAPS Take 10 mg by mouth daily.    [provider]  ipratropium (ATROVENT) 0.03 % nasal spray Place 1-2 sprays into both nostrils 2 (two) times daily as needed (nasal drainage). 04/04/22   Ellamae Sia, DO  pantoprazole (PROTONIX) 40 MG tablet Take 1 tablet (40 mg total) by mouth 2 (two) times daily. 08/31/20   Tressia Danas, MD  PROAIR HFA 108 [phone] Base) MCG/ACT inhaler SMARTSIG:1 Puff(s) By Mouth Every 4-6 Hours PRN 03/29/20   [provider]  omeprazole (PRILOSEC) 20 MG capsule  Take 1 capsule (20 mg total) by mouth daily. 11/08/17 07/18/20  Fayrene Helper, PA-C      Allergies    Patient has no known allergies.    Review of Systems   Review of Systems  Physical Exam Updated Vital Signs BP (!) 167/127 (BP Location: Left Arm)   Pulse 84   Temp 98 F (36.7 C) (Oral)   Resp 18   Ht 5\' 2"  (1.575 m)   Wt 66.2 kg   LMP  (LMP Unknown)   SpO2 100%   BMI 26.69 kg/m  Physical Exam Vitals and nursing note reviewed.  Constitutional:      General: She is not in acute distress.    Appearance: She is well-developed. She is not diaphoretic.  HENT:     Head: Normocephalic and atraumatic.     Comments: Swollen turbinates, posterior nasal drip,  tm normal bilaterally.   No significant posterior oropharyngeal edema or erythema.  Tolerating secretions out difficulty.  He able to rotate her head without discomfort. Eyes:     Pupils: Pupils are equal, round, and reactive to light.  Cardiovascular:     Rate and Rhythm: Normal rate and regular rhythm.     Heart sounds: No murmur heard.    No friction rub. No gallop.  Pulmonary:     Effort: Pulmonary effort is normal.     Breath  sounds: No wheezing or rales.  Abdominal:     General: There is no distension.     Palpations: Abdomen is soft.     Tenderness: There is no abdominal tenderness.  Musculoskeletal:        General: No tenderness.     Cervical back: Normal range of motion and neck supple.  Skin:    General: Skin is warm and dry.  Neurological:     Mental Status: She is alert and oriented to person, place, and time.  Psychiatric:        Behavior: Behavior normal.     ED Results / Procedures / Treatments   Labs (all labs ordered are listed, but only abnormal results are displayed) Labs Reviewed  SARS CORONAVIRUS 2 BY RT PCR    EKG None  Radiology DG Chest Port 1 View  Result Date: 11/29/2022 CLINICAL DATA:  Cough and sore throat, initial encounter EXAM: PORTABLE CHEST 1 VIEW COMPARISON:  01/09/2022  FINDINGS: The heart size and mediastinal contours are within normal limits. Both lungs are clear. The visualized skeletal structures are unremarkable. IMPRESSION: No active disease. Electronically Signed   By: Alcide Clever M.D.   On: 11/29/2022 02:30    Procedures Procedures    Medications Ordered in ED Medications - No data to display  ED Course/ Medical Decision Making/ A&P                                 Medical Decision Making Amount and/or Complexity of Data Reviewed Radiology: ordered.  Risk Prescription drug management.   37 yo F with a chief complaints of cough congestion sore throat is been going on for a little bit over a week.  Clinically the patient has a viral upper respiratory illness.  No obvious bacterial source was found on exam.  I discussed the likely diagnosis with her and encouraged her to follow-up with her doctor.  She however demanded that x-ray be performed of her chest.  She told me it felt like when she had pneumonia previously.  I had a risk-benefit decision with her at bedside she elected for x-ray imaging.  Chest x-ray independently interpreted by me without focal infiltrate or pneumothorax.  Will discharge the patient home.  PCP follow-up.     2:34 AM:  I have discussed the diagnosis/risks/treatment options with the patient and family.  Evaluation and diagnostic testing in the emergency department does not suggest an emergent condition requiring admission or immediate intervention beyond what has been performed at this time.  They will follow up with PCP. We also discussed returning to the ED immediately if new or worsening sx occur. We discussed the sx which are most concerning (e.g., sudden worsening pain, fever, inability to tolerate by mouth) that necessitate immediate return. Medications administered to the patient during their visit and any new prescriptions provided to the patient are listed below.  3:14 AM I was notified by nursing that the patient  had demanded to have an x-ray of her neck performed by radiology.  By the time I got to the room to discuss this with the patient she had eloped.   Medications given during this visit Medications - No data to display   The patient appears reasonably screen and/or stabilized for discharge and I doubt any other medical condition or other [facility] requiring further screening, evaluation, or treatment in the ED at this time prior to discharge.  Final Clinical Impression(s) / ED Diagnoses Final diagnoses:  Viral URI with cough    Rx / DC Orders ED Discharge Orders          Ordered    benzonatate (TESSALON) 100 MG capsule  Every 8 hours        11/29/22 0227    ondansetron (ZOFRAN-ODT) 4 MG disintegrating tablet        11/29/22 0227                Melene Plan, DO 11/29/22 1610

## 2022-11-29 NOTE — ED Notes (Addendum)
MD made aware of pt requesting throat to be X-ray. MD reports some of her throat has been captured that radiation could do harm to pt, and that he spoke with pt about it. Pt denies chest pain at this time as well.

## 2022-11-29 NOTE — ED Notes (Signed)
Pt left before seeing the MD for the second time per request.

## 2022-11-29 NOTE — Discharge Instructions (Signed)
Take tylenol 2 pills 4 times a day and motrin 4 pills 3 times a day.  Drink plenty of fluids.  Return for worsening shortness of breath, headache, confusion. Follow up with your family doctor.  ° °

## 2022-11-29 NOTE — ED Notes (Signed)
Pt stating she is having chest pain on the left side RN made aware

## 2022-11-29 NOTE — ED Notes (Signed)
This RN to room to DC pt, pt reports that she wants to speak with MD cause she came in for her throat and states her symptoms weren't addressed. RN spoke with MD who states he will be in shortly.

## 2022-12-10 ENCOUNTER — Ambulatory Visit (HOSPITAL_COMMUNITY)
Admission: EM | Admit: 2022-12-10 | Discharge: 2022-12-10 | Disposition: A | Discharge status: Home / Self Care | Payer: Medicaid Other | Attending: Family Medicine | Admitting: Family Medicine

## 2022-12-10 ENCOUNTER — Encounter (HOSPITAL_COMMUNITY): Payer: Self-pay

## 2022-12-10 DIAGNOSIS — J019 Acute sinusitis, unspecified: Secondary | ICD-10-CM

## 2022-12-10 DIAGNOSIS — J4521 Mild intermittent asthma with (acute) exacerbation: Secondary | ICD-10-CM

## 2022-12-10 MED ORDER — BENZONATATE 100 MG PO CAPS: 100.0000 mg | ORAL_CAPSULE | Freq: Three times a day (TID) | ORAL | 0 refills | Status: DC | PRN

## 2022-12-10 MED ORDER — PREDNISONE 20 MG PO TABS: 40.0000 mg | ORAL_TABLET | Freq: Every day | ORAL | 0 refills | Status: AC

## 2022-12-10 MED ORDER — ALBUTEROL SULFATE HFA 108 (90 BASE) MCG/ACT IN AERS: 2.0000 | INHALATION_SPRAY | RESPIRATORY_TRACT | 0 refills | Status: DC | PRN

## 2022-12-10 MED ORDER — CEFDINIR 300 MG PO CAPS: 600.0000 mg | ORAL_CAPSULE | Freq: Every day | ORAL | 0 refills | Status: AC

## 2022-12-10 NOTE — ED Triage Notes (Signed)
Patient here today with c/o cough, ST, wheeze, SOB, chest congestion, and runny nose X 2 weeks. She had tried Mucinex last week with no relief. She has a h/o pneumonia. She went to the ED a week ago.

## 2022-12-10 NOTE — Discharge Instructions (Signed)
Albuterol inhaler--do 2 puffs every 4 hours as needed for shortness of breath or wheezing  Take prednisone 20 mg--2 daily for 5 days  Take benzonatate 100 mg, 1 tab every 8 hours as needed for cough.  Take cefdinir 300 mg--2 capsules together daily for 7 days

## 2022-12-10 NOTE — ED Provider Notes (Signed)
[facility]    CSN: [id number] Arrival date & time: 12/10/22  1825      History   Chief Complaint Chief Complaint  Patient presents with   Cough    HPI Rhonda Kennedy is a 37 y.o. female.    Cough Here for cough, shortness of breath and chest tightness x 3-4 weeks  Is coughing up yellow and green mucus and has a sore throat.  Has had some postnasal drainage and nasal congestion, but no rhinorrhea.  She had fever about 2 or 3 weeks ago when she was diagnosed with strep at another facility.   Had CXR at the ER 10/2, negative.     Past Medical History:  Diagnosis Date   Allergy    Anemia    Asthma    Diagnosed as child   Hemorrhoids    History of bronchitis    Hypertension    2012   Recurrent upper respiratory infection (URI)    Seizures (HCC)    Onset 37 yo, most recent Dec 2018    Patient Active Problem List   Diagnosis Date Noted   Chronic rhinitis 04/05/2022   Dysphagia 08/18/2020   Gastroesophageal reflux disease 08/18/2020   Routine screening for STI (sexually transmitted infection) 09/13/2016   Family history of diabetes mellitus in maternal grandmother 01/12/2015   Essential hypertension, benign 10/23/2013   Smoking 10/23/2013   Seizure disorder (HCC) 11/14/2011   Asthma 11/14/2011   Anemia 08/23/2010   Family history of breast cancer 08/23/2010    Past Surgical History:  Procedure Laterality Date   HEMORRHOID BANDING      OB History     Gravida  7   Para  2   Term  2   Preterm  0   AB  3   Living  2      SAB  0   IAB  3   Ectopic  0   Multiple  0   Live Births  2            Home Medications    Prior to Admission medications   Medication Sig Start Date End Date Taking? Authorizing Provider  albuterol (VENTOLIN HFA) 108 (90 Base) MCG/ACT inhaler Inhale 2 puffs into the lungs every 4 (four) hours as needed for wheezing or shortness of breath. 12/10/22  Yes Porter Nakama, Janace Aris, MD  benzonatate  (TESSALON) 100 MG capsule Take 1 capsule (100 mg total) by mouth 3 (three) times daily as needed for cough. 12/10/22  Yes Zenia Resides, MD  cefdinir (OMNICEF) 300 MG capsule Take 2 capsules (600 mg total) by mouth daily for 7 days. 12/10/22 12/17/22 Yes Attila Mccarthy, Janace Aris, MD  levocetirizine (XYZAL) 5 MG tablet Take 5 mg by mouth every evening. 11/23/22  Yes [provider]  predniSONE (DELTASONE) 20 MG tablet Take 2 tablets (40 mg total) by mouth daily with breakfast for 5 days. 12/10/22 12/15/22 Yes Zenia Resides, MD  budesonide-formoterol [facility]) 80-4.5 MCG/ACT inhaler Inhale 2 puffs into the lungs in the morning and at bedtime. with spacer and rinse mouth afterwards. 04/04/22   Ellamae Sia, DO  omeprazole (PRILOSEC) 20 MG capsule Take 1 capsule (20 mg total) by mouth daily. 11/08/17 07/18/20  Fayrene Helper, PA-C    Family History Family History  Problem Relation Age of Onset   Hypertension Mother    Hyperlipidemia Mother    Hypertension Sister    Other Sister  anxiety   Diabetes Maternal Grandmother    Cancer Maternal Grandmother    Crohn's disease Paternal Grandmother    Esophageal cancer Neg Hx    Colon cancer Neg Hx    Stomach cancer Neg Hx    Pancreatic cancer Neg Hx    Rectal cancer Neg Hx     Social History Social History   Tobacco Use   Smoking status: Former    Current packs/day: 0.00    Average packs/day: 0.3 packs/day for 10.0 years (2.5 ttl pk-yrs)    Types: Cigarettes    Start date: 12/24/2006    Quit date: 12/23/2016    Years since quitting: 5.9   Smokeless tobacco: Never  Vaping Use   Vaping status: Former  Substance Use Topics   Alcohol use: Yes    Alcohol/week: 3.0 standard drinks of alcohol    Types: 3 Standard drinks or equivalent per week    Comment: per week   Drug use: Yes    Types: Marijuana     Allergies   Patient has no known allergies.   Review of Systems Review of Systems  Respiratory:  Positive for cough.       Physical Exam Triage Vital Signs ED Triage Vitals [12/10/22 1911]  Encounter Vitals Group     BP 134/89     Systolic BP Percentile      Diastolic BP Percentile      Pulse Rate 64     Resp 16     Temp 99.5 F (37.5 C)     Temp Source Oral     SpO2 98 %     Weight 135 lb (61.2 kg)     Height 5\' 4"  (1.626 m)     Head Circumference      Peak Flow      Pain Score 8     Pain Loc      Pain Education      Exclude from Growth Chart    No data found.  Updated Vital Signs BP 134/89 (BP Location: Left Arm)   Pulse 64   Temp 99.5 F (37.5 C) (Oral)   Resp 16   Ht 5\' 4"  (1.626 m)   Wt 61.2 kg   LMP 12/01/2022 (Exact Date)   SpO2 98%   BMI 23.17 kg/m   Visual Acuity Right Eye Distance:   Left Eye Distance:   Bilateral Distance:    Right Eye Near:   Left Eye Near:    Bilateral Near:     Physical Exam Vitals reviewed.  Constitutional:      General: She is not in acute distress.    Appearance: She is not ill-appearing, toxic-appearing or diaphoretic.  HENT:     Right Ear: Tympanic membrane and ear canal normal.     Left Ear: Tympanic membrane and ear canal normal.     Nose: Congestion present.     Mouth/Throat:     Mouth: Mucous membranes are moist.     Comments: There is some erythema of the posterior oropharynx with some lymphoid hypertrophy there.  No asymmetry. Eyes:     Extraocular Movements: Extraocular movements intact.     Conjunctiva/sclera: Conjunctivae normal.     Pupils: Pupils are equal, round, and reactive to light.  Cardiovascular:     Rate and Rhythm: Normal rate and regular rhythm.     Heart sounds: No murmur heard. Pulmonary:     Effort: No respiratory distress.     Breath sounds: No stridor. No  wheezing, rhonchi or rales.  Chest:     Chest wall: No tenderness.  Musculoskeletal:     Cervical back: Neck supple.  Lymphadenopathy:     Cervical: No cervical adenopathy.  Skin:    Capillary Refill: Capillary refill takes less than 2  seconds.     Coloration: Skin is not jaundiced or pale.  Neurological:     General: No focal deficit present.     Mental Status: She is alert and oriented to person, place, and time.  Psychiatric:        Behavior: Behavior normal.      UC Treatments / Results  Labs (all labs ordered are listed, but only abnormal results are displayed) Labs Reviewed - No data to display  EKG   Radiology No results found.  Procedures Procedures (including critical care time)  Medications Ordered in UC Medications - No data to display  Initial Impression / Assessment and Plan / UC Course  I have reviewed the triage vital signs and the nursing notes.  Pertinent labs & imaging results that were available during my care of the patient were reviewed by me and considered in my medical decision making (see chart for details).       At present lungs are clear.  Omnicef is sent in for acute sinusitis, and albuterol and 5-day prednisone burst are sent in for asthma exacerbation. Final Clinical Impressions(s) / UC Diagnoses   Final diagnoses:  Mild intermittent asthma with exacerbation  Acute sinusitis, recurrence not specified, unspecified location     Discharge Instructions      Albuterol inhaler--do 2 puffs every 4 hours as needed for shortness of breath or wheezing  Take prednisone 20 mg--2 daily for 5 days  Take benzonatate 100 mg, 1 tab every 8 hours as needed for cough.  Take cefdinir 300 mg--2 capsules together daily for 7 days       ED Prescriptions     Medication Sig Dispense Auth. Provider   albuterol (VENTOLIN HFA) 108 (90 Base) MCG/ACT inhaler Inhale 2 puffs into the lungs every 4 (four) hours as needed for wheezing or shortness of breath. 1 each Zenia Resides, MD   predniSONE (DELTASONE) 20 MG tablet Take 2 tablets (40 mg total) by mouth daily with breakfast for 5 days. 10 tablet Zenia Resides, MD   cefdinir (OMNICEF) 300 MG capsule Take 2 capsules (600 mg  total) by mouth daily for 7 days. 14 capsule Cherryl Babin, Janace Aris, MD   benzonatate (TESSALON) 100 MG capsule Take 1 capsule (100 mg total) by mouth 3 (three) times daily as needed for cough. 21 capsule Zenia Resides, MD      PDMP not reviewed this encounter.   Zenia Resides, MD 12/10/22 [phone]

## 2023-04-23 ENCOUNTER — Other Ambulatory Visit: Payer: Self-pay | Admitting: Allergy

## 2023-06-13 ENCOUNTER — Encounter (HOSPITAL_COMMUNITY): Payer: Self-pay

## 2023-06-13 ENCOUNTER — Ambulatory Visit (HOSPITAL_COMMUNITY)
Admission: EM | Admit: 2023-06-13 | Discharge: 2023-06-13 | Disposition: A | Discharge status: Home / Self Care | Attending: Emergency Medicine | Admitting: Emergency Medicine

## 2023-06-13 DIAGNOSIS — L309 Dermatitis, unspecified: Secondary | ICD-10-CM | POA: Diagnosis not present

## 2023-06-13 MED ORDER — METHYLPREDNISOLONE 4 MG PO TBPK: ORAL_TABLET | ORAL | 0 refills | Status: AC

## 2023-06-13 MED ORDER — TRIAMCINOLONE ACETONIDE 0.1 % EX CREA: 1.0000 | TOPICAL_CREAM | Freq: Two times a day (BID) | CUTANEOUS | 0 refills | Status: AC

## 2023-06-13 NOTE — ED Provider Notes (Signed)
 [facility]    CSN: [id number] Arrival date & time: 06/13/23  1626    HISTORY   Chief Complaint  Patient presents with   Rash   HPI Rhonda Kennedy is a pleasant, 38 y.o. female who presents to urgent care today. Patient complains of a rash on her lower abdomen present for approximately 2 weeks.  Patient states she has been applying creams and oils with little relief.  Patient has a history of dermatitis, previously prescribed triamcinolone for this.  Patient also has a history of allergies and asthma, not currently taking any medications for either condition.  The history is provided by the patient.   Past Medical History:  Diagnosis Date   Allergy    Anemia    Asthma    Diagnosed as child   Hemorrhoids    History of bronchitis    Hypertension    2012   Recurrent upper respiratory infection (URI)    Seizures (HCC)    Onset 38 yo, most recent Dec 2018   Patient Active Problem List   Diagnosis Date Noted   Chronic rhinitis 04/05/2022   Dysphagia 08/18/2020   Gastroesophageal reflux disease 08/18/2020   Routine screening for STI (sexually transmitted infection) 09/13/2016   Family history of diabetes mellitus in maternal grandmother 01/12/2015   Essential hypertension, benign 10/23/2013   Smoking 10/23/2013   Seizure disorder (HCC) 11/14/2011   Asthma 11/14/2011   Anemia 08/23/2010   Family history of breast cancer 08/23/2010   Past Surgical History:  Procedure Laterality Date   HEMORRHOID BANDING     OB History     Gravida  7   Para  2   Term  2   Preterm  0   AB  3   Living  2      SAB  0   IAB  3   Ectopic  0   Multiple  0   Live Births  2          Home Medications    Prior to Admission medications   Medication Sig Start Date End Date Taking? Authorizing Provider  albuterol (VENTOLIN HFA) 108 (90 Base) MCG/ACT inhaler Inhale 2 puffs into the lungs every 4 (four) hours as needed for wheezing or shortness of breath.  12/10/22   Banister, Pamela K, MD  benzonatate (TESSALON) 100 MG capsule Take 1 capsule (100 mg total) by mouth 3 (three) times daily as needed for cough. 12/10/22   Banister, Pamela K, MD  budesonide-formoterol (SYMBICORT) 80-4.5 MCG/ACT inhaler Inhale 2 puffs into the lungs in the morning and at bedtime. with spacer and rinse mouth afterwards. 04/04/22   Trudy Fusi, DO  levocetirizine (XYZAL) 5 MG tablet Take 5 mg by mouth every evening. 11/23/22   [provider]  omeprazole (PRILOSEC) 20 MG capsule Take 1 capsule (20 mg total) by mouth daily. 11/08/17 07/18/20  Debbra Fairy, PA-C    Family History Family History  Problem Relation Age of Onset   Hypertension Mother    Hyperlipidemia Mother    Hypertension Sister    Other Sister        anxiety   Diabetes Maternal Grandmother    Cancer Maternal Grandmother    Crohn's disease Paternal Grandmother    Esophageal cancer Neg Hx    Colon cancer Neg Hx    Stomach cancer Neg Hx    Pancreatic cancer Neg Hx    Rectal cancer Neg Hx    Social History Social History  Tobacco Use   Smoking status: Former    Current packs/day: 0.00    Average packs/day: 0.3 packs/day for 10.0 years (2.5 ttl pk-yrs)    Types: Cigarettes    Start date: 12/24/2006    Quit date: 12/23/2016    Years since quitting: 6.4   Smokeless tobacco: Never  Vaping Use   Vaping status: Former  Substance Use Topics   Alcohol use: Yes    Alcohol/week: 3.0 standard drinks of alcohol    Types: 3 Standard drinks or equivalent per week    Comment: per week   Drug use: Yes    Types: Marijuana   Allergies   Patient has no known allergies.  Review of Systems Review of Systems Pertinent findings revealed after performing a 14 point review of systems has been noted in the history of present illness.  Physical Exam Vital Signs BP (!) 147/91 (BP Location: Left Arm)   Pulse 78   Temp 98 F (36.7 C) (Oral)   Resp 18   LMP 05/16/2023   SpO2 96%   No data  found.  Physical Exam Vitals and nursing note reviewed.  Constitutional:      General: She is not in acute distress.    Appearance: Normal appearance.  HENT:     Head: Normocephalic and atraumatic.  Eyes:     Pupils: Pupils are equal, round, and reactive to light.  Cardiovascular:     Rate and Rhythm: Normal rate and regular rhythm.  Pulmonary:     Effort: Pulmonary effort is normal.     Breath sounds: Normal breath sounds.  Abdominal:    Musculoskeletal:        General: Normal range of motion.     Cervical back: Normal range of motion and neck supple.  Skin:    General: Skin is warm and dry.     Findings: Rash present.  Neurological:     General: No focal deficit present.     Mental Status: She is alert and oriented to person, place, and time. Mental status is at baseline.  Psychiatric:        Mood and Affect: Mood normal.        Behavior: Behavior normal.        Thought Content: Thought content normal.        Judgment: Judgment normal.     Visual Acuity Right Eye Distance:   Left Eye Distance:   Bilateral Distance:    Right Eye Near:   Left Eye Near:    Bilateral Near:     UC Couse / Diagnostics / Procedures:     Radiology No results found.  Procedures Procedures (including critical care time) EKG  Pending results:  Labs Reviewed - No data to display  Medications Ordered in UC: Medications - No data to display  UC Diagnoses / Final Clinical Impressions(s)   I have reviewed the triage vital signs and the nursing notes.  Pertinent labs & imaging results that were available during my care of the patient were reviewed by me and considered in my medical decision making (see chart for details).    Final diagnoses:  Eczema, unspecified type   Patient verbalizes that she would like this to go away "quickly".  Patient provided with topical triamcinolone 1% cream and a tapering dose of methylprednisolone.  Return precautions advised.  Please see  discharge instructions below for details of plan of care as provided to patient. ED Prescriptions     Medication Sig Dispense Auth.  Provider   triamcinolone cream (KENALOG) 0.1 % Apply 1 Application topically 2 (two) times daily. Apply to affected area(s) twice daily , do not apply to face. 30 g Theadora Rama Scales, PA-C   methylPREDNISolone (MEDROL DOSEPAK) 4 MG TBPK tablet Take 24 mg on day 1, 20 mg on day 2, 16 mg on day 3, 12 mg on day 4, 8 mg on day 5, 4 mg on day 6.  Take all tablets in each row at once, do not spread tablets out throughout the day. 21 tablet Theadora Rama Scales, PA-C      PDMP not reviewed this encounter.  Pending results:  Labs Reviewed - No data to display    Discharge Instructions      Please read below to learn more about the medications, dosages and frequencies that I recommend to help alleviate your symptoms and to get you feeling better soon: Medrol Dosepak (methylprednisolone): This is a steroid that will significantly calm your upper and lower airways.  Please take one row of tablets daily with your breakfast meal starting tomorrow morning until the prescription is complete.      Topical Kenalog (triamcinolone): This is a topical steroid that can be applied to the affected area of the skin twice daily.  Please use it exactly as directed by the prescription instructions.  Different strengths of topical steroids are used to treat different areas of the body so it is important that this steroid is only applied to the areas for which it has been prescribed.  Unnecessary exposure to topical steroids can cause bleaching of the skin which can be permanent so please immediately discontinue use of this topical steroid once the skin condition is resolved.   If symptoms have not meaningfully improved in the next 3 to 5 days, please return for repeat evaluation or follow-up with your regular provider.  If symptoms have worsened in the next 3 to 5 days, please return  for repeat evaluation or follow-up with your regular provider.    Thank you for visiting urgent care today.  We appreciate the opportunity to participate in your care.       Disposition Upon Discharge:  Condition: stable for discharge home  Patient presented with an acute illness with associated systemic symptoms and significant discomfort requiring urgent management. In my opinion, this is a condition that a prudent lay person (someone who possesses an average knowledge of health and medicine) may potentially expect to result in complications if not addressed urgently such as respiratory distress, impairment of bodily function or dysfunction of bodily organs.   Routine symptom specific, illness specific and/or disease specific instructions were discussed with the patient and/or caregiver at length.   As such, the patient has been evaluated and assessed, work-up was performed and treatment was provided in alignment with urgent care protocols and evidence based medicine.  Patient/parent/caregiver has been advised that the patient may require follow up for further testing and treatment if the symptoms continue in spite of treatment, as clinically indicated and appropriate.  Patient/parent/caregiver has been advised to return to the [facility] or PCP if no better; to PCP or the Emergency Department if new signs and symptoms develop, or if the current signs or symptoms continue to change or worsen for further workup, evaluation and treatment as clinically indicated and appropriate  The patient will follow up with their current PCP if and as advised. If the patient does not currently have a PCP we will assist them in obtaining one.  The patient may need specialty follow up if the symptoms continue, in spite of conservative treatment and management, for further workup, evaluation, consultation and treatment as clinically indicated and appropriate.  Patient/parent/caregiver verbalized understanding and  agreement of plan as discussed.  All questions were addressed during visit.  Please see discharge instructions below for further details of plan.  This office note has been dictated using Teaching laboratory technician.  Unfortunately, this method of dictation can sometimes lead to typographical or grammatical errors.  I apologize for your inconvenience in advance if this occurs.  Please do not hesitate to reach out to me if clarification is needed.      Eloise Hake Scales, [place] 06/13/23 [phone]

## 2023-06-13 NOTE — ED Triage Notes (Signed)
 Pt c/o itchy rash for over 2wks to lower abdomen. States used creams and oils with little relief.

## 2023-06-13 NOTE — Discharge Instructions (Signed)
 Please read below to learn more about the medications, dosages and frequencies that I recommend to help alleviate your symptoms and to get you feeling better soon: Medrol Dosepak (methylprednisolone): This is a steroid that will significantly calm your upper and lower airways.  Please take one row of tablets daily with your breakfast meal starting tomorrow morning until the prescription is complete.      Topical Kenalog (triamcinolone): This is a topical steroid that can be applied to the affected area of the skin twice daily.  Please use it exactly as directed by the prescription instructions.  Different strengths of topical steroids are used to treat different areas of the body so it is important that this steroid is only applied to the areas for which it has been prescribed.  Unnecessary exposure to topical steroids can cause bleaching of the skin which can be permanent so please immediately discontinue use of this topical steroid once the skin condition is resolved.   If symptoms have not meaningfully improved in the next 3 to 5 days, please return for repeat evaluation or follow-up with your regular provider.  If symptoms have worsened in the next 3 to 5 days, please return for repeat evaluation or follow-up with your regular provider.    Thank you for visiting urgent care today.  We appreciate the opportunity to participate in your care.

## 2023-11-01 ENCOUNTER — Emergency Department (HOSPITAL_COMMUNITY)

## 2023-11-01 ENCOUNTER — Emergency Department (HOSPITAL_COMMUNITY)
Admission: EM | Admit: 2023-11-01 | Discharge: 2023-11-01 | Disposition: A | Discharge status: Home / Self Care | Attending: Emergency Medicine | Admitting: Emergency Medicine

## 2023-11-01 DIAGNOSIS — J45909 Unspecified asthma, uncomplicated: Secondary | ICD-10-CM | POA: Insufficient documentation

## 2023-11-01 DIAGNOSIS — J3489 Other specified disorders of nose and nasal sinuses: Secondary | ICD-10-CM | POA: Diagnosis not present

## 2023-11-01 DIAGNOSIS — Z87891 Personal history of nicotine dependence: Secondary | ICD-10-CM | POA: Diagnosis not present

## 2023-11-01 DIAGNOSIS — R569 Unspecified convulsions: Secondary | ICD-10-CM | POA: Insufficient documentation

## 2023-11-01 DIAGNOSIS — I10 Essential (primary) hypertension: Secondary | ICD-10-CM | POA: Insufficient documentation

## 2023-11-01 LAB — URINALYSIS, ROUTINE W REFLEX MICROSCOPIC
Bilirubin Urine: NEGATIVE
Glucose, UA: NEGATIVE mg/dL
Hgb urine dipstick: NEGATIVE
Ketones, ur: NEGATIVE mg/dL
Nitrite: NEGATIVE
Protein, ur: 30 mg/dL — AB
Specific Gravity, Urine: 1.015 (ref 1.005–1.030)
pH: 7 (ref 5.0–8.0)

## 2023-11-01 LAB — CBC WITH DIFFERENTIAL/PLATELET
Abs Immature Granulocytes: 0.02 K/uL (ref 0.00–0.07)
Basophils Absolute: 0.1 K/uL (ref 0.0–0.1)
Basophils Relative: 1 %
Eosinophils Absolute: 0 K/uL (ref 0.0–0.5)
Eosinophils Relative: 0 %
HCT: 42.3 % (ref 36.0–46.0)
Hemoglobin: 14 g/dL (ref 12.0–15.0)
Immature Granulocytes: 0 %
Lymphocytes Relative: 11 %
Lymphs Abs: 0.7 K/uL (ref 0.7–4.0)
MCH: 31.9 pg (ref 26.0–34.0)
MCHC: 33.1 g/dL (ref 30.0–36.0)
MCV: 96.4 fL (ref 80.0–100.0)
Monocytes Absolute: 0.3 K/uL (ref 0.1–1.0)
Monocytes Relative: 4 %
Neutro Abs: 5.2 K/uL (ref 1.7–7.7)
Neutrophils Relative %: 84 %
Platelets: 289 K/uL (ref 150–400)
RBC: 4.39 MIL/uL (ref 3.87–5.11)
RDW: 13.2 % (ref 11.5–15.5)
WBC: 6.2 K/uL (ref 4.0–10.5)
nRBC: 0 % (ref 0.0–0.2)

## 2023-11-01 LAB — I-STAT CHEM 8, ED
BUN: 4 mg/dL — ABNORMAL LOW (ref 6–20)
Calcium, Ion: 1.18 mmol/L (ref 1.15–1.40)
Chloride: 106 mmol/L (ref 98–111)
Creatinine, Ser: 0.8 mg/dL (ref 0.44–1.00)
Glucose, Bld: 106 mg/dL — ABNORMAL HIGH (ref 70–99)
HCT: 44 % (ref 36.0–46.0)
Hemoglobin: 15 g/dL (ref 12.0–15.0)
Potassium: 3.9 mmol/L (ref 3.5–5.1)
Sodium: 140 mmol/L (ref 135–145)
TCO2: 22 mmol/L (ref 22–32)

## 2023-11-01 LAB — COMPREHENSIVE METABOLIC PANEL WITH GFR
ALT: 12 U/L (ref 0–44)
AST: 25 U/L (ref 15–41)
Albumin: 3.9 g/dL (ref 3.5–5.0)
Alkaline Phosphatase: 35 U/L — ABNORMAL LOW (ref 38–126)
Anion gap: 13 (ref 5–15)
BUN: 5 mg/dL — ABNORMAL LOW (ref 6–20)
CO2: 19 mmol/L — ABNORMAL LOW (ref 22–32)
Calcium: 9.1 mg/dL (ref 8.9–10.3)
Chloride: 106 mmol/L (ref 98–111)
Creatinine, Ser: 0.85 mg/dL (ref 0.44–1.00)
GFR, Estimated: >60 mL/min (ref >60)
Glucose, Bld: 109 mg/dL — ABNORMAL HIGH (ref 70–99)
Potassium: 3.9 mmol/L (ref 3.5–5.1)
Sodium: 138 mmol/L (ref 135–145)
Total Bilirubin: 0.8 mg/dL (ref 0.0–1.2)
Total Protein: 7 g/dL (ref 6.5–8.1)

## 2023-11-01 LAB — RAPID URINE DRUG SCREEN, HOSP PERFORMED
Amphetamines: NOT DETECTED
Barbiturates: NOT DETECTED
Benzodiazepines: NOT DETECTED
Cocaine: NOT DETECTED
Opiates: NOT DETECTED
Tetrahydrocannabinol: POSITIVE — AB

## 2023-11-01 LAB — ETHANOL: Alcohol, Ethyl (B): <15 mg/dL (ref <15)

## 2023-11-01 LAB — MAGNESIUM: Magnesium: 2.1 mg/dL (ref 1.7–2.4)

## 2023-11-01 LAB — HCG, SERUM, QUALITATIVE: Preg, Serum: NEGATIVE

## 2023-11-01 LAB — CBG MONITORING, ED: Glucose-Capillary: 102 mg/dL — ABNORMAL HIGH (ref 70–99)

## 2023-11-01 MED ORDER — LEVETIRACETAM 500 MG PO TABS: 500.0000 mg | ORAL_TABLET | Freq: Two times a day (BID) | ORAL | 0 refills | Status: AC
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
## 2023-11-01 NOTE — ED Notes (Signed)
 CCMD called by this RN

## 2023-11-01 NOTE — ED Notes (Signed)
 Pt states I will not be taking the keppra  until I see my primary care doctor and I already told that doctor that.

## 2023-11-01 NOTE — ED Notes (Signed)
 Patient transported to CT
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
## 2023-11-01 NOTE — Discharge Instructions (Addendum)
 We evaluated you for your seizure.  Your testing in the emergency department is reassuring.  We did not see any dangerous cause of your seizure.  Since you have a history of seizures and have been on an antiseizure medication, we would like you to resume this medicine.  We also evaluated you for your nasal pain.  We did not see any dangerous cause of your symptoms.  We would recommend that you follow-up with ENT.  You can follow-up with Dr. Roark.  If you have any new or worsening symptoms such as recurrent seizures, severe headaches, fevers, difficulty breathing, or any other new symptoms, please return to the emergency department.  Per [place]  DMV statutes, patients with seizures are not allowed to drive until  they have been seizure-free for six months. Use caution when using heavy equipment or power tools. Avoid working on ladders or at heights. Take showers instead of baths. Ensure the water temperature is not too high on the home water heater. Do not go swimming alone. When caring for infants or small children, sit down when holding, feeding, or changing them to minimize risk of injury to the child in the event you have a seizure.

## 2023-12-30 NOTE — Progress Notes (Unsigned)
 SABRA
